# Patient Record
Sex: Male | Born: 1955 | Race: White | Hispanic: No | Marital: Single | Smoking: Never smoker
Health system: Southern US, Community
[De-identification: ages and names within clinical notes are randomized; demographics above are authoritative.]

## PROBLEM LIST (undated history)

## (undated) DIAGNOSIS — I10 Essential (primary) hypertension: Secondary | ICD-10-CM

## (undated) HISTORY — PX: HERNIA REPAIR: SHX51

---

## 2007-05-13 HISTORY — PX: DG GALL BLADDER: HXRAD326

## 2016-10-17 ENCOUNTER — Other Ambulatory Visit: Payer: Self-pay

## 2016-10-17 ENCOUNTER — Emergency Department (HOSPITAL_COMMUNITY): Payer: BLUE CROSS/BLUE SHIELD

## 2016-10-17 ENCOUNTER — Encounter (HOSPITAL_COMMUNITY): Payer: Self-pay | Admitting: Emergency Medicine

## 2016-10-17 ENCOUNTER — Emergency Department (HOSPITAL_COMMUNITY)
Admission: EM | Admit: 2016-10-17 | Discharge: 2016-10-17 | Disposition: A | Discharge status: Home / Self Care | Payer: BLUE CROSS/BLUE SHIELD | Attending: Emergency Medicine | Admitting: Emergency Medicine

## 2016-10-17 DIAGNOSIS — Z7982 Long term (current) use of aspirin: Secondary | ICD-10-CM | POA: Diagnosis not present

## 2016-10-17 DIAGNOSIS — R079 Chest pain, unspecified: Secondary | ICD-10-CM

## 2016-10-17 DIAGNOSIS — Z79899 Other long term (current) drug therapy: Secondary | ICD-10-CM | POA: Diagnosis not present

## 2016-10-17 DIAGNOSIS — M79602 Pain in left arm: Secondary | ICD-10-CM

## 2016-10-17 DIAGNOSIS — I10 Essential (primary) hypertension: Secondary | ICD-10-CM | POA: Diagnosis not present

## 2016-10-17 DIAGNOSIS — R002 Palpitations: Secondary | ICD-10-CM

## 2016-10-17 HISTORY — DX: Essential (primary) hypertension: I10

## 2016-10-17 LAB — CBC
HEMATOCRIT: 50.9 % (ref 39.0–52.0)
Hemoglobin: 17.8 g/dL — ABNORMAL HIGH (ref 13.0–17.0)
MCH: 31.2 pg (ref 26.0–34.0)
MCHC: 35 g/dL (ref 30.0–36.0)
MCV: 89.1 fL (ref 78.0–100.0)
Platelets: 185 10*3/uL (ref 150–400)
RBC: 5.71 MIL/uL (ref 4.22–5.81)
RDW: 13 % (ref 11.5–15.5)
WBC: 8.9 10*3/uL (ref 4.0–10.5)

## 2016-10-17 LAB — BASIC METABOLIC PANEL
Anion gap: 9 (ref 5–15)
BUN: 15 mg/dL (ref 6–20)
CHLORIDE: 105 mmol/L (ref 101–111)
CO2: 23 mmol/L (ref 22–32)
Calcium: 9.2 mg/dL (ref 8.9–10.3)
Creatinine, Ser: 1.1 mg/dL (ref 0.61–1.24)
GFR calc Af Amer: >60 mL/min (ref >60)
GFR calc non Af Amer: >60 mL/min (ref >60)
GLUCOSE: 147 mg/dL — AB (ref 65–99)
POTASSIUM: 3.8 mmol/L (ref 3.5–5.1)
Sodium: 137 mmol/L (ref 135–145)

## 2016-10-17 LAB — D-DIMER, QUANTITATIVE (NOT AT ARMC): D DIMER QUANT: 0.5 ug{FEU}/mL (ref 0.00–0.50)

## 2016-10-17 LAB — I-STAT TROPONIN, ED: Troponin i, poc: 0.01 ng/mL (ref 0.00–0.08)

## 2016-10-17 LAB — TROPONIN I: Troponin I: <0.03 ng/mL (ref <0.03)

## 2016-10-17 MED ORDER — MORPHINE SULFATE (PF) 4 MG/ML IV SOLN: 4.0000 mg | Freq: Once | INTRAVENOUS | Status: DC

## 2016-10-17 NOTE — ED Notes (Signed)
PT continues to report no chest pain. PT reports slight headache 3/10 and neck discomfort. BP elevated. PT is due for meds between 8-9pm. Dr. Rosalia Hammersay made aware.

## 2016-10-17 NOTE — ED Notes (Signed)
Returns from XRAY

## 2016-10-17 NOTE — ED Provider Notes (Signed)
[facility]EMER[facility]ENCY DEPT Provider Note   CSN: [id number] Arrival date & time: 10/17/16  1328     History   Chief Complaint Chief Complaint  Patient presents with  . Chest Pain    HPI Marco Vazquez is a 61 y.o. male.  HPI Pt felt left sided chest pain today associated with a racing heart beat.  He also felt the pain radiate into his arm.  He noticed the sx today when he woke up from a nap.   He felt nauseated and had some loose stools.  It lasted about an hour or so.  He has also been having sx occurring at night recently.  The gets short of breath and has the pain.  These sx have been getting worse.   No history of heart disease.  No history of lung disease.  Father had an MI in his 1s.  Pt does exercise regularly and never has pain or discomfort with that.   Past Medical History:  Diagnosis Date  . Hypertension     There are no active problems to display for this patient.   History reviewed. No pertinent surgical history.     Home Medications    Prior to Admission medications   Medication Sig Start Date End Date Taking? Authorizing Provider  aspirin EC 81 MG tablet Take 162 mg by mouth daily.   Yes [provider]  Cyanocobalamin (VITAMIN B 12 PO) Take 1 tablet by mouth daily.   Yes [provider]  losartan (COZAAR) 50 MG tablet Take 50 mg by mouth daily.   Yes [provider]  Multiple Vitamins-Minerals (MULTIVITAMIN WITH MINERALS) tablet Take 1 tablet by mouth daily.   Yes [provider]    Family History History reviewed. No pertinent family history.  Social History Social History  Substance Use Topics  . Smoking status: Never Smoker  . Smokeless tobacco: Never Used  . Alcohol use No     Allergies   Contrast media [iodinated diagnostic agents] and Sulfa antibiotics   Review of Systems Review of Systems  HENT:       Diagnosed with an inner ear infection, treated with prednisone, finished the rx one week ago    Neurological: Positive for dizziness.  All other systems reviewed and are negative.    Physical Exam Updated Vital Signs BP 133/85   Pulse 78   Temp 98.6 F (37 C) (Oral)   Resp 16   Ht 1.854 m (6\' 1" )   Wt 104.3 kg (230 lb)   SpO2 97%   BMI 30.34 kg/m   Physical Exam  Constitutional: He appears well-developed and well-nourished. No distress.  HENT:  Head: Normocephalic and atraumatic.  Right Ear: External ear normal.  Left Ear: External ear normal.  Eyes: Conjunctivae are normal. Right eye exhibits no discharge. Left eye exhibits no discharge. No scleral icterus.  Neck: Neck supple. No tracheal deviation present.  Cardiovascular: Normal rate, regular rhythm and intact distal pulses.   Pulmonary/Chest: Effort normal and breath sounds normal. No stridor. No respiratory distress. He has no wheezes. He has no rales.  Abdominal: Soft. Bowel sounds are normal. He exhibits no distension. There is no tenderness. There is no rebound and no guarding.  Musculoskeletal: He exhibits no edema or tenderness.  Neurological: He is alert. He has normal strength. No cranial nerve deficit (no facial droop, extraocular movements intact, no slurred speech) or sensory deficit. He exhibits normal muscle tone. He displays no seizure activity. Coordination normal.  Skin: Skin  is warm and dry. No rash noted.  Psychiatric: He has a normal mood and affect.  Nursing note and vitals reviewed.    ED Treatments / Results  Labs (all labs ordered are listed, but only abnormal results are displayed) Labs Reviewed  BASIC METABOLIC PANEL - Abnormal; Notable for the following:       Result Value   Glucose, Bld 147 (*)    All other components within normal limits  CBC - Abnormal; Notable for the following:    Hemoglobin 17.8 (*)    All other components within normal limits  D-DIMER, QUANTITATIVE (NOT AT [facility])  Rosezena SensorI-STAT TROPOININ, ED  I-STAT TROPOININ, ED    EKG  EKG  Interpretation  Date/Time:  Friday October 17 2016 13:48:50 EDT Ventricular Rate:  90 PR Interval:  140 QRS Duration: 96 QT Interval:  365 QTC Calculation: 447 R Axis:   85 Text Interpretation:  Sinus rhythm Probable left atrial enlargement Consider right ventricular hypertrophy No significant change since last tracing , improved ECG quality Confirmed by Linwood DibblesKnapp, Filip Luten [phone]([id number]) on 10/17/2016 1:50:55 PM       Radiology Dg Chest 2 View  Result Date: 10/17/2016 CLINICAL DATA:  Chest pain, dizziness, shortness of breath EXAM: CHEST  2 VIEW COMPARISON:  None available FINDINGS: Mild hyperinflation without focal pneumonia, collapse or consolidation. Negative for edema, effusion or pneumothorax. Trachea is midline. Normal heart size and vascularity. No acute osseous finding. Monitor leads overlie the chest. IMPRESSION: Mild hyperinflation.  No acute process. Electronically Signed   By: Judie PetitM.  Shick M.D.   On: 10/17/2016 14:47    Procedures Procedures (including critical care time)  Medications Ordered in ED Medications - No data to display   Initial Impression / Assessment and Plan / ED Course  I have reviewed the triage vital signs and the nursing notes.  Pertinent labs & imaging results that were available during my care of the patient were reviewed by me and considered in my medical decision making (see chart for details).   Pt presents to the ED with chest pain.  He exercises regularly and does not have chest pain with that.  Sx are less likely cardiac related.  Low risk for ACS.  Heart score of 2.  Will check d dimer and serial cardiac enzymes.  If negative I think he can safely follow up for an outpatient stress test.  Dr Rosalia Hammersay will follow up on 2nd troponin and dimer Final Clinical Impressions(s) / ED Diagnoses   Final diagnoses:  Chest pain, unspecified type      Linwood DibblesKnapp, Brinly Maietta, MD 10/17/16 1636

## 2016-10-17 NOTE — ED Notes (Signed)
Care handoff to Nikki, RN 

## 2016-10-17 NOTE — ED Provider Notes (Signed)
There is a 61 year old man seen by Dr. Lynelle DoctorKnapp received in sign out by me. Patient has had some episodic palpitations with left arm pain. He swims regularly and has not had symptoms while he has been exerting himself. Workup here reveals no evidence of acute ST elevation with normal troponin and delta troponin. He also had a d-dimer that was normal. I discussed the patient's options with him including admission. He prefers discharge with cardiology follow-up and this was the original plan with Dr. Lynelle DoctorKnapp. We have discussed return precautions including return of symptoms and need for close follow-up and he voices understanding.   Margarita Grizzleay, Caedence Snowden, MD 10/17/16 [id number]

## 2016-10-17 NOTE — Discharge Instructions (Signed)
Call cardiology office on Monday for follow up. Return if any return of symptoms.

## 2016-10-17 NOTE — ED Triage Notes (Signed)
Pt here with issues with inner ear x 3 weeks and having left sided CP into left arm with SOB upon waking this am

## 2016-10-17 NOTE — ED Notes (Addendum)
PT reports no chest pain at this time. PT reports a headache 3/10.

## 2016-10-18 ENCOUNTER — Observation Stay (HOSPITAL_COMMUNITY)
Admission: EM | Admit: 2016-10-18 | Discharge: 2016-10-20 | Disposition: A | Discharge status: Home / Self Care | Payer: BLUE CROSS/BLUE SHIELD | Attending: Family Medicine | Admitting: Family Medicine

## 2016-10-18 ENCOUNTER — Observation Stay (HOSPITAL_BASED_OUTPATIENT_CLINIC_OR_DEPARTMENT_OTHER): Payer: BLUE CROSS/BLUE SHIELD

## 2016-10-18 ENCOUNTER — Encounter (HOSPITAL_COMMUNITY): Payer: Self-pay | Admitting: Emergency Medicine

## 2016-10-18 ENCOUNTER — Emergency Department (HOSPITAL_COMMUNITY): Payer: BLUE CROSS/BLUE SHIELD

## 2016-10-18 DIAGNOSIS — R0789 Other chest pain: Principal | ICD-10-CM | POA: Insufficient documentation

## 2016-10-18 DIAGNOSIS — R079 Chest pain, unspecified: Secondary | ICD-10-CM

## 2016-10-18 DIAGNOSIS — I10 Essential (primary) hypertension: Secondary | ICD-10-CM | POA: Diagnosis not present

## 2016-10-18 DIAGNOSIS — Z91041 Radiographic dye allergy status: Secondary | ICD-10-CM | POA: Diagnosis not present

## 2016-10-18 DIAGNOSIS — Z882 Allergy status to sulfonamides status: Secondary | ICD-10-CM | POA: Insufficient documentation

## 2016-10-18 DIAGNOSIS — I259 Chronic ischemic heart disease, unspecified: Secondary | ICD-10-CM | POA: Diagnosis not present

## 2016-10-18 DIAGNOSIS — Z7982 Long term (current) use of aspirin: Secondary | ICD-10-CM | POA: Diagnosis not present

## 2016-10-18 DIAGNOSIS — I208 Other forms of angina pectoris: Secondary | ICD-10-CM | POA: Diagnosis not present

## 2016-10-18 DIAGNOSIS — R002 Palpitations: Secondary | ICD-10-CM | POA: Insufficient documentation

## 2016-10-18 LAB — ECHOCARDIOGRAM COMPLETE
HEIGHTINCHES: 73 in
WEIGHTICAEL: 3716.8 [oz_av]

## 2016-10-18 LAB — CBC
HCT: 49.4 % (ref 39.0–52.0)
HEMOGLOBIN: 16.7 g/dL (ref 13.0–17.0)
MCH: 30.3 pg (ref 26.0–34.0)
MCHC: 33.8 g/dL (ref 30.0–36.0)
MCV: 89.7 fL (ref 78.0–100.0)
Platelets: 164 10*3/uL (ref 150–400)
RBC: 5.51 MIL/uL (ref 4.22–5.81)
RDW: 13.1 % (ref 11.5–15.5)
WBC: 8.5 10*3/uL (ref 4.0–10.5)

## 2016-10-18 LAB — BASIC METABOLIC PANEL
ANION GAP: 8 (ref 5–15)
BUN: 14 mg/dL (ref 6–20)
CALCIUM: 8.9 mg/dL (ref 8.9–10.3)
CO2: 26 mmol/L (ref 22–32)
Chloride: 103 mmol/L (ref 101–111)
Creatinine, Ser: 1.03 mg/dL (ref 0.61–1.24)
GFR calc non Af Amer: >60 mL/min (ref >60)
Glucose, Bld: 94 mg/dL (ref 65–99)
Potassium: 3.6 mmol/L (ref 3.5–5.1)
Sodium: 137 mmol/L (ref 135–145)

## 2016-10-18 LAB — LIPID PANEL
Cholesterol: 165 mg/dL (ref 0–200)
HDL: 31 mg/dL — AB (ref >40)
LDL CALC: 95 mg/dL (ref 0–99)
Total CHOL/HDL Ratio: 5.3 RATIO
Triglycerides: 195 mg/dL — ABNORMAL HIGH (ref <150)
VLDL: 39 mg/dL (ref 0–40)

## 2016-10-18 LAB — TROPONIN I

## 2016-10-18 LAB — I-STAT TROPONIN, ED
TROPONIN I, POC: 0 ng/mL (ref 0.00–0.08)
Troponin i, poc: 0 ng/mL (ref 0.00–0.08)

## 2016-10-18 MED ORDER — MORPHINE SULFATE (PF) 4 MG/ML IV SOLN
2.0000 mg | INTRAVENOUS | Status: DC | PRN
  Administered 2016-10-20: 2 mg via INTRAVENOUS
  Filled 2016-10-18: qty 1

## 2016-10-18 MED ORDER — NITROGLYCERIN 0.4 MG SL SUBL
0.4000 mg | SUBLINGUAL_TABLET | SUBLINGUAL | Status: DC | PRN
  Administered 2016-10-18 – 2016-10-20 (×2): 0.4 mg via SUBLINGUAL
  Filled 2016-10-18 (×2): qty 1

## 2016-10-18 MED ORDER — ACETAMINOPHEN 325 MG PO TABS
650.0000 mg | ORAL_TABLET | ORAL | Status: DC | PRN
  Administered 2016-10-18 – 2016-10-20 (×5): 650 mg via ORAL
  Filled 2016-10-18 (×5): qty 2

## 2016-10-18 MED ORDER — ASPIRIN EC 81 MG PO TBEC
162.0000 mg | DELAYED_RELEASE_TABLET | Freq: Every day | ORAL | Status: DC
  Administered 2016-10-18 – 2016-10-20 (×3): 162 mg via ORAL
  Filled 2016-10-18 (×3): qty 2

## 2016-10-18 MED ORDER — ONDANSETRON HCL 4 MG/2ML IJ SOLN: 4.0000 mg | Freq: Four times a day (QID) | INTRAMUSCULAR | Status: DC | PRN

## 2016-10-18 MED ORDER — ENOXAPARIN SODIUM 40 MG/0.4ML ~~LOC~~ SOLN
40.0000 mg | Freq: Every day | SUBCUTANEOUS | Status: DC
  Administered 2016-10-18: 40 mg via SUBCUTANEOUS
  Filled 2016-10-18 (×2): qty 0.4

## 2016-10-18 MED ORDER — LOSARTAN POTASSIUM 50 MG PO TABS
50.0000 mg | ORAL_TABLET | Freq: Every day | ORAL | Status: DC
  Administered 2016-10-18 – 2016-10-20 (×3): 50 mg via ORAL
  Filled 2016-10-18 (×3): qty 1

## 2016-10-18 MED ORDER — NITROGLYCERIN IN D5W 200-5 MCG/ML-% IV SOLN
0.0000 ug/min | Freq: Once | INTRAVENOUS | Status: AC
Start: 2016-10-18 — End: 2016-10-18
  Administered 2016-10-18: 5 ug/min via INTRAVENOUS
  Filled 2016-10-18: qty 250

## 2016-10-18 MED ORDER — GI COCKTAIL ~~LOC~~: 30.0000 mL | Freq: Four times a day (QID) | ORAL | Status: DC | PRN

## 2016-10-18 NOTE — Progress Notes (Signed)
Pt got to room 2w10 @ 0610 alert and oriented times 3. Denied pain on arrival. No skin issues noted. He was oriented to call light and bathroom.

## 2016-10-18 NOTE — ED Triage Notes (Signed)
Pt reports L sided CP with radiation to L arm and jaw that woke him up from sleep approx 0000. Reports nausea, SOB, and dizziness. Seen yesterday for same S/S and was DC, told to return if pain came back. A/OX4.

## 2016-10-18 NOTE — ED Provider Notes (Signed)
[facility] Provider Note   CSN: [id number] Arrival date & time: 10/18/16  0036     History   Chief Complaint Chief Complaint  Patient presents with  . Chest Pain    HPI Marco Vazquez is a 61 y.o. male.  Patient with history of HTN presents with left chest pain that woke him from sleep tonight, associated with left arm pain, SOB and nausea. He reports he has not felt in his usual state of health for the past one day, noting a loss of appetite and energy. No fever, cough, vomiting, diarrhea or URI symptoms. Chest pain is intermittent and does not seem to be exertional. He was seen in the ED yesterday afternoon for similar pain and declined admission that was offered. He returned to the emergency department after the pain woke him from sleep tonight.    The history is provided by the patient. No language interpreter was used.  Chest Pain   Associated symptoms include nausea and shortness of breath. Pertinent negatives include no fever, no headaches and no weakness.    Past Medical History:  Diagnosis Date  . Hypertension     There are no active problems to display for this patient.   History reviewed. No pertinent surgical history.     Home Medications    Prior to Admission medications   Medication Sig Start Date End Date Taking? Authorizing Provider  aspirin EC 81 MG tablet Take 162 mg by mouth daily.    [provider]  Cyanocobalamin (VITAMIN B 12 PO) Take 1 tablet by mouth daily.    [provider]  losartan (COZAAR) 50 MG tablet Take 50 mg by mouth daily.    [provider]  Multiple Vitamins-Minerals (MULTIVITAMIN WITH MINERALS) tablet Take 1 tablet by mouth daily.    [provider]    Family History No family history on file.  Social History Social History  Substance Use Topics  . Smoking status: Never Smoker  . Smokeless tobacco: Never Used  . Alcohol use No     Allergies   Contrast media [iodinated  diagnostic agents] and Sulfa antibiotics   Review of Systems Review of Systems  Constitutional: Positive for activity change and appetite change. Negative for chills and fever.  Respiratory: Positive for shortness of breath.   Cardiovascular: Positive for chest pain.  Gastrointestinal: Positive for nausea.  Musculoskeletal:       Chest pain that radiates to left arm  Skin: Negative.   Neurological: Negative.  Negative for weakness and headaches.     Physical Exam Updated Vital Signs BP 117/76   Pulse 64   Temp 98 F (36.7 C) (Oral)   Resp 17   Ht 6\' 1"  (1.854 m)   Wt 104.3 kg (230 lb)   SpO2 96%   BMI 30.34 kg/m   Physical Exam  Constitutional: He is oriented to person, place, and time. He appears well-developed and well-nourished.  HENT:  Head: Normocephalic.  Eyes: Conjunctivae are normal.  Neck: Normal range of motion. Neck supple.  Cardiovascular: Normal rate and regular rhythm.   No murmur heard. Pulmonary/Chest: Effort normal and breath sounds normal. He has no wheezes. He has no rales. He exhibits tenderness.  Abdominal: Soft. Bowel sounds are normal. There is no tenderness. There is no rebound and no guarding.  Musculoskeletal: Normal range of motion. He exhibits no edema.  Neurological: He is alert and oriented to person, place, and time.  Skin: Skin is warm and dry. No rash  noted.  Psychiatric: He has a normal mood and affect.     ED Treatments / Results  Labs (all labs ordered are listed, but only abnormal results are displayed) Labs Reviewed  BASIC METABOLIC PANEL  CBC  I-STAT TROPOININ, ED    EKG  EKG Interpretation  Date/Time:  Saturday October 18 2016 00:42:28 EDT Ventricular Rate:  82 PR Interval:  146 QRS Duration: 94 QT Interval:  388 QTC Calculation: 453 R Axis:   56 Text Interpretation:  Normal sinus rhythm Possible Left atrial enlargement Borderline ECG No significant change was found Confirmed by Glynn Octaveancour, Stephen [phone]([id number]) on 10/18/2016  12:58:34 AM       Radiology Dg Chest 2 View  Result Date: 10/18/2016 CLINICAL DATA:  Chest pain and dizziness. EXAM: CHEST  2 VIEW COMPARISON:  Radiographs yesterday at 1437 hour FINDINGS: The cardiomediastinal contours are normal. Stable hyperinflation. Pulmonary vasculature is normal. No consolidation, pleural effusion, or pneumothorax. No acute osseous abnormalities are seen. IMPRESSION: Stable hyperinflation. No acute abnormality, no change from prior exam. Electronically Signed   By: Rubye OaksMelanie  Ehinger M.D.   On: 10/18/2016 01:37   Dg Chest 2 View  Result Date: 10/17/2016 CLINICAL DATA:  Chest pain, dizziness, shortness of breath EXAM: CHEST  2 VIEW COMPARISON:  None available FINDINGS: Mild hyperinflation without focal pneumonia, collapse or consolidation. Negative for edema, effusion or pneumothorax. Trachea is midline. Normal heart size and vascularity. No acute osseous finding. Monitor leads overlie the chest. IMPRESSION: Mild hyperinflation.  No acute process. Electronically Signed   By: Judie PetitM.  Shick M.D.   On: 10/17/2016 14:47    Procedures Procedures (including critical care time)  Medications Ordered in ED Medications  nitroGLYCERIN (NITROSTAT) SL tablet 0.4 mg (0.4 mg Sublingual Given 10/18/16 0210)  nitroGLYCERIN 50 mg in dextrose 5 % 250 mL (0.2 mg/mL) infusion (15 mcg/min Intravenous Rate/Dose Change 10/18/16 0312)     Initial Impression / Assessment and Plan / ED Course  I have reviewed the triage vital signs and the nursing notes.  Pertinent labs & imaging results that were available during my care of the patient were reviewed by me and considered in my medical decision making (see chart for details).     Patient with CP concerning for ACS, without ST elevations or positive initial troponin. He has Heart Score of 4.   He is seen and evaluated by Dr. Manus Gunningancour. During episode of active pain an EKG was repeated and is unchanged. He is given SL NTG with some relief. IV drip  started.   Cardiology paged for admission. Patient agrees for plan of admission.  Pr cardiology, medicine to admit with cardiology consultation. D[facility] Dr[facility] Mad[facility] Flavorsondell Broeker[facility].  Final Clinical Impressions(s) / ED Diagnoses   Final diagnoses:  None   1. Chest pain  New Prescriptions New Prescriptions   No medications on file     Danne HarborUpstill, Yesennia Hirota, PA-C 10/31/16 [id number]    Glynn Octaveancour, Stephen, MD 11/03/16 1057

## 2016-10-18 NOTE — Progress Notes (Signed)
*  PRELIMINARY RESULTS* Echocardiogram 2D Echocardiogram has been performed.  Marco Vazquez, Marco Vazquez 10/18/2016, 3:48 PM

## 2016-10-18 NOTE — ED Notes (Signed)
Repaged Cards  

## 2016-10-18 NOTE — Progress Notes (Signed)
Patient seen and evaluated earlier this am by my associate. Please refer to H and P for details. Cardiology on board and has recommended stress test. Further plans pending stress test results.  VSS  Will reassess next am or sooner should new medical problem arise or patient be cleared for discharge.  Niaomi Cartaya, [employer]

## 2016-10-18 NOTE — H&P (Signed)
History and Physical    Marco Vazquez ZOX:[id number] DOB: 08-18-1955 DOA: 10/18/2016  Referring MD/NP/PA: Elpidio Anis, PA-C PCP: Elias Else, MD  Patient coming from: Home  Chief Complaint: Chest pain  HPI: Marco Vazquez is a 61 y.o. male with medical history significant of HTN, who presents with intermittent left-sided chest pain since yesterday.  He describes the pain as pressure-like and reported numbness/ tingling pain in the left arm. Associated symptoms included nausea, palpitations, loose stool, diaphoresis, and shortness of breath. At baseline patient was very active and swims. This all occurred yesterday morning and he was evaluated in the emergency department, but workup was otherwise noted to be negative including no evidence of ST elevation on EKG, d-dimer, troponin, and delta troponin. They discussed with the patient possibly being admitted at that time, but the patient wished to be discharged to follow-up with cardiac and GI as an outpatient. They discussed precautions for the patient to return. After the patient went home and went to sleep he was awakened out of sleep with left-sided chest pain with tingling pain in the left arm. Noted associated symptoms of palpitations and nausea, but denied having any diaphoresis. Patient notes that his father had heart issues in his 49s. Denies any vomiting, fever, chills, cough, abdominal pain, or loss of consciousness.  ED Course: Upon admission into the emergency department patient was seen have vital signs relatively within normal limits. Labs revealed no acute abnormalities including troponin and delta troponin. Chest x-ray showed stable hyperinflation. EKG otherwise showed no acute signs of ischemia. Patient was placed on nitroglycerin drip as he noted that it helped relieve pain symptoms. Cardiology was consulted, but recommended admission to hospitalist service for continued trending of enzymes.   Review of Systems: As per HPI otherwise 10  point review of systems negative.   Past Medical History:  Diagnosis Date  . Hypertension     History reviewed. No pertinent surgical history.   reports that he has never smoked. He has never used smokeless tobacco. He reports that he does not drink alcohol or use drugs.  Allergies  Allergen Reactions  . Contrast Media [Iodinated Diagnostic Agents]     "started sneezing "  . Sulfa Antibiotics     "everything speeds up"    No family history on file.  Prior to Admission medications   Medication Sig Start Date End Date Taking? Authorizing Provider  aspirin EC 81 MG tablet Take 162 mg by mouth daily.    [provider]  Cyanocobalamin (VITAMIN B 12 PO) Take 1 tablet by mouth daily.    [provider]  losartan (COZAAR) 50 MG tablet Take 50 mg by mouth daily.    [provider]  Multiple Vitamins-Minerals (MULTIVITAMIN WITH MINERALS) tablet Take 1 tablet by mouth daily.    [provider]    Physical Exam:   Constitutional: Well-developed male who appears NAD, calm, comfortable Vitals:   10/18/16 0330 10/18/16 0345 10/18/16 0415 10/18/16 0430  BP: 102/66 98/65 105/70 106/70  Pulse: 70 69 71 65  Resp: 13 15 19 14   Temp:      TempSrc:      SpO2: 95%  94% 95%  Weight:      Height:       Eyes: PERRL, lids and conjunctivae normal ENMT: Mucous membranes are moist. Posterior pharynx clear of any exudate or lesions. Normal dentition.  Neck: normal, supple, no masses, no thyromegaly Respiratory: clear to auscultation bilaterally, no wheezing, no crackles. Normal respiratory effort.  No accessory muscle use.  Cardiovascular: Regular rate and rhythm, no murmurs / rubs / gallops. No extremity edema. 2+ pedal pulses. No carotid bruits. No tenderness to palpation of the chest wall Abdomen: no tenderness, no masses palpated. No hepatosplenomegaly. Bowel sounds positive.  Musculoskeletal: no clubbing / cyanosis. No joint deformity upper and lower  extremities. Good ROM, no contractures. Normal muscle tone.  Skin: no rashes, lesions, ulcers. No induration Neurologic: CN 2-12 grossly intact. Sensation intact, DTR normal. Strength 5/5 in all 4.  Psychiatric: Normal judgment and insight. Alert and oriented x 3. Anxious mood.     Labs on Admission: I have personally reviewed following labs and imaging studies  CBC:  Recent Labs Lab 10/17/16 1336 10/18/16 0041  WBC 8.9 8.5  HGB 17.8* 16.7  HCT 50.9 49.4  MCV 89.1 89.7  PLT 185 164   Basic Metabolic Panel:  Recent Labs Lab 10/17/16 1336 10/18/16 0041  NA 137 137  K 3.8 3.6  CL 105 103  CO2 23 26  GLUCOSE 147* 94  BUN 15 14  CREATININE 1.10 1.03  CALCIUM 9.2 8.9   GFR: Estimated Creatinine Clearance: 95.6 mL/min (by C-G formula based on SCr of 1.03 mg/dL). Liver Function Tests: No results for input(s): AST, ALT, ALKPHOS, BILITOT, PROT, ALBUMIN in the last 168 hours. No results for input(s): LIPASE, AMYLASE in the last 168 hours. No results for input(s): AMMONIA in the last 168 hours. Coagulation Profile: No results for input(s): INR, PROTIME in the last 168 hours. Cardiac Enzymes:  Recent Labs Lab 10/17/16 1644  TROPONINI <0.03   BNP (last 3 results) No results for input(s): PROBNP in the last 8760 hours. HbA1C: No results for input(s): HGBA1C in the last 72 hours. CBG: No results for input(s): GLUCAP in the last 168 hours. Lipid Profile: No results for input(s): CHOL, HDL, LDLCALC, TRIG, CHOLHDL, LDLDIRECT in the last 72 hours. Thyroid Function Tests: No results for input(s): TSH, T4TOTAL, FREET4, T3FREE, THYROIDAB in the last 72 hours. Anemia Panel: No results for input(s): VITAMINB12, FOLATE, FERRITIN, TIBC, IRON, RETICCTPCT in the last 72 hours. Urine analysis: No results found for: COLORURINE, APPEARANCEUR, LABSPEC, PHURINE, GLUCOSEU, HGBUR, BILIRUBINUR, KETONESUR, PROTEINUR, UROBILINOGEN, NITRITE, LEUKOCYTESUR Sepsis Labs: No results found for  this or any previous visit (from the past 240 hour(s)).   Radiological Exams on Admission: Dg Chest 2 View  Result Date: 10/18/2016 CLINICAL DATA:  Chest pain and dizziness. EXAM: CHEST  2 VIEW COMPARISON:  Radiographs yesterday at 1437 hour FINDINGS: The cardiomediastinal contours are normal. Stable hyperinflation. Pulmonary vasculature is normal. No consolidation, pleural effusion, or pneumothorax. No acute osseous abnormalities are seen. IMPRESSION: Stable hyperinflation. No acute abnormality, no change from prior exam. Electronically Signed   By: Rubye Oaks M.D.   On: 10/18/2016 01:37   Dg Chest 2 View  Result Date: 10/17/2016 CLINICAL DATA:  Chest pain, dizziness, shortness of breath EXAM: CHEST  2 VIEW COMPARISON:  None available FINDINGS: Mild hyperinflation without focal pneumonia, collapse or consolidation. Negative for edema, effusion or pneumothorax. Trachea is midline. Normal heart size and vascularity. No acute osseous finding. Monitor leads overlie the chest. IMPRESSION: Mild hyperinflation.  No acute process. Electronically Signed   By: Judie Petit.  Shick M.D.   On: 10/17/2016 14:47    EKG: Independently reviewed. Normal sinus rhythm with possible left atrial enlargement  Assessment/Plan Chest pain: Patient has a concerning story although patient has had negative troponins and EKG otherwise unchanged. Heart score 4. - Admitted to a telemetry bed -  Trend cardiac enzymes - continue Nitro gtt - Check echocardiogram and lipid panel - IVF NS at 75ml/hr - Continue aspi39rin - Cardiology consulted, follow-up for further recommendations  Essential hypertension - Continue losartan   DVT prophylaxis: Lovenox   Code Status: Full Family Communication: No family present at bedside Disposition Plan: Discharge home once medically stable Consults called: Cardiology  Admission status: Observation  Clydie Braunondell A Kamer MD [facility] Pager [phone]  If 7PM-7AM, please contact  night-coverage [email or web address] Password [email or web address]  10/18/2016, 4:57 AM

## 2016-10-18 NOTE — Consult Note (Signed)
Cardiology Consultation:   Patient ID: Marco Vazquez; [id number]; Aug 01, 1955   Admit date: 10/18/2016 Date of Consult: 10/18/2016  Primary Care Provider: Elias Elseeade, Robert, MD Primary Cardiologist: new Primary Electrophysiologist:  new   Patient Profile:   Marco Vazquez is a 61 y.o. male referred for evaluation of chest pain  History of Present Illness:   Marco Vazquez  with a hx of HTN who is being seen today for the evaluation of chest pain at the request of Dr. Cena BentonVega.The patient is very active and swims on a regular basis. He developed an ear infection about one week ago and was treated with prednisone. He began to have a heaviness and fullness in his left arm and chest associated with symptoms of hard heart beating. He was seen on several occasions as an outpatient before presenting with worsening symptoms which were relieved with nitroglycerin and associated with shortness of breath and nausea. He has ruled out for myocardial infarction and his ECG is unrevealing. He is referred for additional evaluation. The patient has no family history of coronary disease except that he notes his father had a "mild heart attack" at age 62, but is currently 6890 and otherwise healthy. The patient has remained active and since his current episode began has not been swimming. He has been monitored overnight with no arrhythmias.  Past Medical History:  Diagnosis Date  . Hypertension     Past Surgical History:  Procedure Laterality Date  . DG GALL BLADDER  2009   removed  . HERNIA REPAIR       Inpatient Medications: Scheduled Meds: . aspirin EC  162 mg Oral Daily  . enoxaparin (LOVENOX) injection  40 mg Subcutaneous Daily  . losartan  50 mg Oral Daily   Continuous Infusions:  PRN Meds: acetaminophen, gi cocktail, morphine injection, nitroGLYCERIN, ondansetron (ZOFRAN) IV  Allergies:    Allergies  Allergen Reactions  . Contrast Media [Iodinated Diagnostic Agents]     "started sneezing "  . Sulfa  Antibiotics     "everything speeds up"    Social History:   Social History   Social History  . Marital status: Single    Spouse name: N/A  . Number of children: N/A  . Years of education: N/A   Occupational History  . Not on file.   Social History Main Topics  . Smoking status: Never Smoker  . Smokeless tobacco: Never Used  . Alcohol use No  . Drug use: No  . Sexual activity: No   Other Topics Concern  . Not on file   Social History Narrative  . No narrative on file    Family History:   The patient's family history is not on file.  ROS:  Please see the history of present illness.  ROS  All other ROS reviewed and negative.     Physical Exam/Data:   Vitals:   10/18/16 0515 10/18/16 0530 10/18/16 0553 10/18/16 0610  BP: 105/69 102/76  125/77  Pulse: 70 61  67  Resp: 13 16  17   Temp:    97.9 F (36.6 C)  TempSrc:    Oral  SpO2: 92% 94%  96%  Weight:    232 lb 4.8 oz (105.4 kg)  Height:   6\' 1"  (1.854 m)     Intake/Output Summary (Last 24 hours) at 10/18/16 1057 Last data filed at 10/18/16 0904  Gross per 24 hour  Intake  0 ml  Output                0 ml  Net                0 ml   Filed Weights   10/18/16 0040 10/18/16 0610  Weight: 230 lb (104.3 kg) 232 lb 4.8 oz (105.4 kg)   Body mass index is 30.65 kg/m.  General:  Well nourished, well developed, in no acute distress HEENT: normal Lymph: no adenopathy Neck: 6 cm JVD Endocrine:  No thryomegaly Vascular: No carotid bruits; FA pulses 2+ bilaterally without bruits  Cardiac:  normal S1, S2; RRR; no murmur  Lungs:  clear to auscultation bilaterally, no wheezing, rhonchi or rales  Abd: soft, nontender, no hepatomegaly  Ext: no edema Musculoskeletal:  No deformities, BUE and BLE strength normal and equal Skin: warm and dry  Neuro:  CNs 2-12 intact, no focal abnormalities noted Psych:  Normal affect    EKG:  The EKG was personally reviewed and demonstrates normal sinus rhythm with  normal axis and intervals  Relevant CV Studies: Cardiac enzymes are all negative  Laboratory Data:  Chemistry Recent Labs Lab 10/17/16 1336 10/18/16 0041  NA 137 137  K 3.8 3.6  CL 105 103  CO2 23 26  GLUCOSE 147* 94  BUN 15 14  CREATININE 1.10 1.03  CALCIUM 9.2 8.9  GFRNONAA >60 >60  GFRAA >60 >60  ANIONGAP 9 8    No results for input(s): PROT, ALBUMIN, AST, ALT, ALKPHOS, BILITOT in the last 168 hours. Hematology Recent Labs Lab 10/17/16 1336 10/18/16 0041  WBC 8.9 8.5  RBC 5.71 5.51  HGB 17.8* 16.7  HCT 50.9 49.4  MCV 89.1 89.7  MCH 31.2 30.3  MCHC 35.0 33.8  RDW 13.0 13.1  PLT 185 164   Cardiac Enzymes Recent Labs Lab 10/17/16 1644 10/18/16 0507 10/18/16 0803  TROPONINI <0.03 <0.03 <0.03    Recent Labs Lab 10/17/16 1356 10/18/16 0054 10/18/16 0357  TROPIPOC 0.01 0.00 0.00    BNPNo results for input(s): BNP, PROBNP in the last 168 hours.  DDimer  Recent Labs Lab 10/17/16 1644  DDIMER 0.50    Radiology/Studies:  Dg Chest 2 View  Result Date: 10/18/2016 CLINICAL DATA:  Chest pain and dizziness. EXAM: CHEST  2 VIEW COMPARISON:  Radiographs yesterday at 1437 hour FINDINGS: The cardiomediastinal contours are normal. Stable hyperinflation. Pulmonary vasculature is normal. No consolidation, pleural effusion, or pneumothorax. No acute osseous abnormalities are seen. IMPRESSION: Stable hyperinflation. No acute abnormality, no change from prior exam. Electronically Signed   By: Rubye Oaks M.D.   On: 10/18/2016 01:37   Dg Chest 2 View  Result Date: 10/17/2016 CLINICAL DATA:  Chest pain, dizziness, shortness of breath EXAM: CHEST  2 VIEW COMPARISON:  None available FINDINGS: Mild hyperinflation without focal pneumonia, collapse or consolidation. Negative for edema, effusion or pneumothorax. Trachea is midline. Normal heart size and vascularity. No acute osseous finding. Monitor leads overlie the chest. IMPRESSION: Mild hyperinflation.  No acute process.  Electronically Signed   By: Judie Petit.  Shick M.D.   On: 10/17/2016 14:47    Assessment and Plan:   1. Atypical chest pain - the patient has some typical but mostly atypical features with his chest pain and has ruled out for MI and has no ECG abnormalities. I've recommended that he undergo exercise stress testing prior to discharge. If his stress test is moderate to high risk then left heart catheterization will be recommended. 2.  Palpitations - I suspect this is related to his steroid use. His steroids have been discontinued and he has had no arrhythmias on the monitor. He will undergo watchful waiting. We might consider having him wear a cardiac monitor as an outpatient. 3. Hypertension - his blood pressure is currently well-controlled. No change in medications at this time.   Signed, Lewayne Bunting, MD  10/18/2016 10:57 AM

## 2016-10-19 ENCOUNTER — Observation Stay (HOSPITAL_BASED_OUTPATIENT_CLINIC_OR_DEPARTMENT_OTHER): Payer: BLUE CROSS/BLUE SHIELD

## 2016-10-19 DIAGNOSIS — R079 Chest pain, unspecified: Secondary | ICD-10-CM | POA: Diagnosis not present

## 2016-10-19 DIAGNOSIS — I208 Other forms of angina pectoris: Secondary | ICD-10-CM | POA: Diagnosis not present

## 2016-10-19 LAB — NM MYOCAR MULTI W/SPECT W/WALL MOTION / EF
CHL CUP RESTING HR STRESS: 57 {beats}/min
CSEPED: 8 min
CSEPEDS: 28 s
CSEPEW: 10.1 METS
CSEPHR: 88 %
MPHR: 159 {beats}/min
Peak HR: 141 {beats}/min

## 2016-10-19 MED ORDER — TECHNETIUM TC 99M TETROFOSMIN IV KIT
10.0000 | PACK | Freq: Once | INTRAVENOUS | Status: AC | PRN
  Administered 2016-10-19: 10 via INTRAVENOUS

## 2016-10-19 MED ORDER — ZOLPIDEM TARTRATE 5 MG PO TABS
5.0000 mg | ORAL_TABLET | Freq: Every evening | ORAL | Status: DC | PRN
  Administered 2016-10-19: 5 mg via ORAL
  Filled 2016-10-19: qty 1

## 2016-10-19 MED ORDER — TECHNETIUM TC 99M TETROFOSMIN IV KIT
30.0000 | PACK | Freq: Once | INTRAVENOUS | Status: AC | PRN
  Administered 2016-10-19: 30 via INTRAVENOUS

## 2016-10-19 NOTE — Progress Notes (Addendum)
Patient Name: Marco Vazquez Date of Encounter: 10/19/2016  [facility]  [facility] Problem List     Principal Problem:   Chest pain Active Problems:   Essential hypertension     Subjective   Seen in nuclear medicine for exercise stress testing. No acute ST or TW changes on exercise portion of stress test.   Inpatient Medications    Scheduled Meds: . aspirin EC  162 mg Oral Daily  . enoxaparin (LOVENOX) injection  40 mg Subcutaneous Daily  . losartan  50 mg Oral Daily   Continuous Infusions:  PRN Meds: acetaminophen, gi cocktail, morphine injection, nitroGLYCERIN, ondansetron (ZOFRAN) IV   Vital Signs    Vitals:   10/18/16 0610 10/18/16 1514 10/18/16 2024 10/19/16 0547  BP: 125/77 (!) 116/53 119/66 109/72  Pulse: 67 69 64 65  Resp: 17 18 18 18   Temp: 97.9 F (36.6 C)  97.7 F (36.5 C) 98.3 F (36.8 C)  TempSrc: Oral  Oral Oral  SpO2: 96% 97% 97% 97%  Weight: 232 lb 4.8 oz (105.4 kg)     Height:        Intake/Output Summary (Last 24 hours) at 10/19/16 0903 Last data filed at 10/18/16 1600  Gross per 24 hour  Intake           433.78 ml  Output                2 ml  Net           431.78 ml   Filed Weights   10/18/16 0040 10/18/16 0610  Weight: 230 lb (104.3 kg) 232 lb 4.8 oz (105.4 kg)    Physical Exam    GEN: Well nourished, well developed, in no acute distress.  HEENT: Grossly normal.  Neck: Supple, no JVD, carotid bruits, or masses. Cardiac: RRR, no murmurs, rubs, or gallops. No clubbing, cyanosis, edema.  Radials/DP/PT 2+ and equal bilaterally.  Respiratory:  Respirations regular and unlabored, clear to auscultation bilaterally. GI: Soft, nontender, nondistended, BS + x 4. MS: no deformity or atrophy. Skin: warm and dry, no rash. Neuro:  Strength and sensation are intact. Psych: AAOx3.  Normal affect.  Labs    CBC  Recent Labs  10/17/16 1336 10/18/16 0041  WBC 8.9 8.5  HGB 17.8* 16.7  HCT 50.9 49.4  MCV 89.1 89.7  PLT 185  164   Basic Metabolic Panel  Recent Labs  10/17/16 1336 10/18/16 0041  NA 137 137  K 3.8 3.6  CL 105 103  CO2 23 26  GLUCOSE 147* 94  BUN 15 14  CREATININE 1.10 1.03  CALCIUM 9.2 8.9   Liver Function Tests No results for input(s): AST, ALT, ALKPHOS, BILITOT, PROT, ALBUMIN in the last 72 hours. No results for input(s): LIPASE, AMYLASE in the last 72 hours. Cardiac Enzymes  Recent Labs  10/17/16 1644 10/18/16 0507 10/18/16 0803  TROPONINI <0.03 <0.03 <0.03   BNP Invalid input(s): POCBNP D-Dimer  Recent Labs  10/17/16 1644  DDIMER 0.50   Hemoglobin A1C No results for input(s): HGBA1C in the last 72 hours. Fasting Lipid Panel  Recent Labs  10/18/16 0507  CHOL 165  HDL 31*  LDLCALC 95  TRIG 409195*  CHOLHDL 5.3   Thyroid Function Tests No results for input(s): TSH, T4TOTAL, T3FREE, THYROIDAB in the last 72 hours.  Invalid input(s): FREET3  Telemetry    NSR on tele in nuc med - Personally Reviewed  ECG    normal sinus rhythm with normal  axis and intervals - Personally Reviewed  Radiology    Dg Chest 2 View  Result Date: 10/18/2016 CLINICAL DATA:  Chest pain and dizziness. EXAM: CHEST  2 VIEW COMPARISON:  Radiographs yesterday at 1437 hour FINDINGS: The cardiomediastinal contours are normal. Stable hyperinflation. Pulmonary vasculature is normal. No consolidation, pleural effusion, or pneumothorax. No acute osseous abnormalities are seen. IMPRESSION: Stable hyperinflation. No acute abnormality, no change from prior exam. Electronically Signed   By: Rubye Oaks M.D.   On: 10/18/2016 01:37   Dg Chest 2 View  Result Date: 10/17/2016 CLINICAL DATA:  Chest pain, dizziness, shortness of breath EXAM: CHEST  2 VIEW COMPARISON:  None available FINDINGS: Mild hyperinflation without focal pneumonia, collapse or consolidation. Negative for edema, effusion or pneumothorax. Trachea is midline. Normal heart size and vascularity. No acute osseous finding. Monitor  leads overlie the chest. IMPRESSION: Mild hyperinflation.  No acute process. Electronically Signed   By: Judie Petit.  Shick M.D.   On: 10/17/2016 14:47    Cardiac Studies    2D ECHO: 10/18/2016 LV EF: 55% -   60% Study Conclusions - Left ventricle: The cavity size was normal. Systolic function was   normal. The estimated ejection fraction was in the range of 55%   to 60%. Wall motion was normal; there were no regional wall   motion abnormalities. Left ventricular diastolic function   parameters were normal. - Aortic valve: There was trivial regurgitation. - Mitral valve: Valve area by pressure half-time: 1.75 cm^2. - Atrial septum: No defect or patent foramen ovale was identified.   Patient Profile     Marco Vazquez is a 61 y.o. male with a history of HTN and possible family hx of CAD who presented to [facility] ED on 10/17/16 with chest pain. He ruled out and was discharged home from the ED. He then returned on 10/18/16 with recurrent chest pain and palpitations and was admitted for further work up and evaluation.  Assessment & Plan    Chest pain: he has ruled out for MI. 2D ECHO with normal LV function and no WMAs. He is undergoing exercise myoview today. AWAIT NUCLEAR IMAGES. If low risk, he can likely be discharged home.   Palpitations: felt to be 2/2 to a recent steroid taper for an ear infection. Tele has been unremarkable so far. We may consider an outpatient monitor.  HTN: BP well controlled at this time.    Signed, Cline Crock, PA-C  10/19/2016, 9:03 AM   Cardiology Attending  Patient seen and examined. He has undergone exercise myoview stess testing. If low risk he can be discharged home with outpatient cardiology followup. I would suggest he try and avoid oral steroids.  Leonia Reeves.D.

## 2016-10-19 NOTE — Progress Notes (Signed)
PROGRESS NOTE    Marco Vazquez  ZOX:[id number] DOB: [birth year] DOA: 10/18/2016 PCP: Elias Elseeade, Robert, MD    Brief Narrative:   61 y.o. male with medical history significant of HTN, who presents with intermittent left-sided chest pain since yesterday.  He describes the pain as pressure-like and reported numbness/ tingling pain in the left arm.  Stress test came back high risk. Cardiology planning further work up   Assessment & Plan:   Principal Problem:   Chest pain - Cardiology on board and assisting - high risk on stress testing.  Active Problems:   Essential hypertension -  On losartan, stable  DVT prophylaxis: Lovenox Code Status: Full Family Communication: d/c spouse Disposition Plan: once cleared for d/c by cardiology.   Consultants:   Cardiology   Procedures: myoview   Antimicrobials: none   Subjective: Pt has no new complaints.   Objective: Vitals:   10/19/16 1010 10/19/16 1012 10/19/16 1014 10/19/16 1248  BP: (!) 212/80 (!) 176/92 (!) 158/93 117/71  Pulse:    75  Resp:    18  Temp:    98.4 F (36.9 C)  TempSrc:    Oral  SpO2:    97%  Weight:      Height:        Intake/Output Summary (Last 24 hours) at 10/19/16 1819 Last data filed at 10/19/16 1720  Gross per 24 hour  Intake              480 ml  Output                1 ml  Net              479 ml   Filed Weights   10/18/16 0040 10/18/16 0610  Weight: 104.3 kg (230 lb) 105.4 kg (232 lb 4.8 oz)    Examination:  General exam: Appears calm and comfortable, in nad. Respiratory system: Clear to auscultation. Respiratory effort normal. Cardiovascular system: S1 & S2 heard, RRR.  Gastrointestinal system: Abdomen is nondistended, soft and nontender. No organomegaly or masses felt. Normal bowel sounds heard. Central nervous system: Alert and oriented. No focal neurological deficits. Extremities: Symmetric 5 x 5 power. Skin: No rashes, lesions or ulcers, on limited exam. Psychiatry: Mood & affect  appropriate.   Data Reviewed: I have personally reviewed following labs and imaging studies  CBC:  Recent Labs Lab 10/17/16 1336 10/18/16 0041  WBC 8.9 8.5  HGB 17.8* 16.7  HCT 50.9 49.4  MCV 89.1 89.7  PLT 185 164   Basic Metabolic Panel:  Recent Labs Lab 10/17/16 1336 10/18/16 0041  NA 137 137  K 3.8 3.6  CL 105 103  CO2 23 26  GLUCOSE 147* 94  BUN 15 14  CREATININE 1.10 1.03  CALCIUM 9.2 8.9   GFR: Estimated Creatinine Clearance: 96 mL/min (by C-G formula based on SCr of 1.03 mg/dL). Liver Function Tests: No results for input(s): AST, ALT, ALKPHOS, BILITOT, PROT, ALBUMIN in the last 168 hours. No results for input(s): LIPASE, AMYLASE in the last 168 hours. No results for input(s): AMMONIA in the last 168 hours. Coagulation Profile: No results for input(s): INR, PROTIME in the last 168 hours. Cardiac Enzymes:  Recent Labs Lab 10/17/16 1644 10/18/16 0507 10/18/16 0803  TROPONINI <0.03 <0.03 <0.03   BNP (last 3 results) No results for input(s): PROBNP in the last 8760 hours. HbA1C: No results for input(s): HGBA1C in the last 72 hours. CBG: No results for input(s): GLUCAP in the last  168 hours. Lipid Profile:  Recent Labs  10/18/16 0507  CHOL 165  HDL 31*  LDLCALC 95  TRIG 119*  CHOLHDL 5.3   Thyroid Function Tests: No results for input(s): TSH, T4TOTAL, FREET4, T3FREE, THYROIDAB in the last 72 hours. Anemia Panel: No results for input(s): VITAMINB12, FOLATE, FERRITIN, TIBC, IRON, RETICCTPCT in the last 72 hours. Sepsis Labs: No results for input(s): PROCALCITON, LATICACIDVEN in the last 168 hours.  No results found for this or any previous visit (from the past 240 hour(s)).   Radiology Studies: Dg Chest 2 View  Result Date: 10/18/2016 CLINICAL DATA:  Chest pain and dizziness. EXAM: CHEST  2 VIEW COMPARISON:  Radiographs yesterday at 1437 hour FINDINGS: The cardiomediastinal contours are normal. Stable hyperinflation. Pulmonary vasculature is  normal. No consolidation, pleural effusion, or pneumothorax. No acute osseous abnormalities are seen. IMPRESSION: Stable hyperinflation. No acute abnormality, no change from prior exam. Electronically Signed   By: Rubye Oaks M.D.   On: 10/18/2016 01:37   Nm Myocar Multi W/spect W/wall Motion / Ef  Result Date: 10/19/2016 CLINICAL DATA:  Chest pain EXAM: MYOCARDIAL IMAGING WITH SPECT (REST AND EXERCISE) GATED LEFT VENTRICULAR WALL MOTION STUDY LEFT VENTRICULAR EJECTION FRACTION TECHNIQUE: Standard myocardial SPECT imaging was performed after resting intravenous injection of 10 mCi Tc-43m tetrofosmin. Subsequently, exercise tolerance test was performed by the patient under the supervision of the Cardiology staff. At peak-stress, 30 mCi Tc-30m tetrofosmin was injected intravenously and standard myocardial SPECT imaging was performed. Quantitative gated imaging was also performed to evaluate left ventricular wall motion, and estimate left ventricular ejection fraction. COMPARISON:  None. FINDINGS: Perfusion: There are multiple stress-induced perfusion defects. Baker in the anterior septum, inferior wall, and inferolateral walls. At the anterior apex, the size is small. The conglomerate defect in the inferior and inferolateral walls is large. The defect in the inferolateral wall is partially re- perfused. Wall Motion: Normal left ventricular wall motion. No left ventricular dilation. Left Ventricular Ejection Fraction: 59 % End diastolic volume 109 ml End systolic volume 44 ml IMPRESSION: 1. There are stress-induced perfusion defects in the anteroseptal wall, inferolateral wall, and inferior wall. 2. Normal left ventricular wall motion. 3. Left ventricular ejection fraction 59% 4. Non invasive risk stratification*: High risk. *2012 Appropriate Use Criteria for Coronary Revascularization Focused Update: J Am Coll Cardiol. 2012;59(9):857-881. http://content.dementiazones.com.aspx?articleid=1201161  Electronically Signed   By: Jolaine Click M.D.   On: 10/19/2016 13:43    Scheduled Meds: . aspirin EC  162 mg Oral Daily  . enoxaparin (LOVENOX) injection  40 mg Subcutaneous Daily  . losartan  50 mg Oral Daily   Continuous Infusions:   LOS: 0 days    Time spent: > 25 minutes  Penny Pia, MD [facility] Pager [phone]  If 7PM-7AM, please contact night-coverage [email or web address] Password [facility] 10/19/2016, 6:19 PM

## 2016-10-19 NOTE — Progress Notes (Signed)
Pt stated earlier this evening that a cardiology PA informed he would go to cath lab tomorrow. No orders placed. Education initiated --pt shown cardiac cath/intervention education channel video.

## 2016-10-20 ENCOUNTER — Encounter (HOSPITAL_COMMUNITY): Payer: Self-pay | Admitting: Cardiology

## 2016-10-20 ENCOUNTER — Encounter (HOSPITAL_COMMUNITY)
Admission: EM | Disposition: A | Discharge status: Home / Self Care | Payer: Self-pay | Source: Home / Self Care | Attending: Emergency Medicine

## 2016-10-20 DIAGNOSIS — I10 Essential (primary) hypertension: Secondary | ICD-10-CM | POA: Diagnosis not present

## 2016-10-20 DIAGNOSIS — R0789 Other chest pain: Secondary | ICD-10-CM | POA: Diagnosis not present

## 2016-10-20 DIAGNOSIS — I200 Unstable angina: Secondary | ICD-10-CM

## 2016-10-20 DIAGNOSIS — Z7982 Long term (current) use of aspirin: Secondary | ICD-10-CM | POA: Diagnosis not present

## 2016-10-20 DIAGNOSIS — R002 Palpitations: Secondary | ICD-10-CM | POA: Diagnosis not present

## 2016-10-20 DIAGNOSIS — R079 Chest pain, unspecified: Secondary | ICD-10-CM

## 2016-10-20 DIAGNOSIS — R9439 Abnormal result of other cardiovascular function study: Secondary | ICD-10-CM

## 2016-10-20 HISTORY — PX: LEFT HEART CATH AND CORONARY ANGIOGRAPHY: CATH118249

## 2016-10-20 LAB — PROTIME-INR
INR: 1.07
PROTHROMBIN TIME: 13.9 s (ref 11.4–15.2)

## 2016-10-20 SURGERY — LEFT HEART CATH AND CORONARY ANGIOGRAPHY
Anesthesia: LOCAL

## 2016-10-20 MED ORDER — VERAPAMIL HCL 2.5 MG/ML IV SOLN
INTRAVENOUS | Status: AC
  Filled 2016-10-20: qty 2

## 2016-10-20 MED ORDER — IOPAMIDOL (ISOVUE-370) INJECTION 76%
INTRAVENOUS | Status: AC
  Filled 2016-10-20: qty 100

## 2016-10-20 MED ORDER — FENTANYL CITRATE (PF) 100 MCG/2ML IJ SOLN
INTRAMUSCULAR | Status: DC | PRN
  Administered 2016-10-20: 25 ug via INTRAVENOUS

## 2016-10-20 MED ORDER — SODIUM CHLORIDE 0.9 % WEIGHT BASED INFUSION: 1.0000 mL/kg/h | INTRAVENOUS | Status: DC

## 2016-10-20 MED ORDER — IOPAMIDOL (ISOVUE-370) INJECTION 76%
INTRAVENOUS | Status: DC | PRN
  Administered 2016-10-20: 50 mL via INTRA_ARTERIAL

## 2016-10-20 MED ORDER — NITROGLYCERIN IN D5W 200-5 MCG/ML-% IV SOLN
0.0000 ug/min | INTRAVENOUS | Status: DC
  Administered 2016-10-20: 5 ug/min via INTRAVENOUS
  Filled 2016-10-20: qty 250

## 2016-10-20 MED ORDER — VERAPAMIL HCL 2.5 MG/ML IV SOLN
INTRAVENOUS | Status: DC | PRN
  Administered 2016-10-20: 10 mL via INTRA_ARTERIAL

## 2016-10-20 MED ORDER — PANTOPRAZOLE SODIUM 40 MG PO TBEC: 40.0000 mg | DELAYED_RELEASE_TABLET | Freq: Every day | ORAL | 0 refills | Status: DC

## 2016-10-20 MED ORDER — HEPARIN (PORCINE) IN NACL 2-0.9 UNIT/ML-% IJ SOLN
INTRAMUSCULAR | Status: AC
  Filled 2016-10-20: qty 1000

## 2016-10-20 MED ORDER — MIDAZOLAM HCL 2 MG/2ML IJ SOLN
INTRAMUSCULAR | Status: AC
  Filled 2016-10-20: qty 2

## 2016-10-20 MED ORDER — LIDOCAINE HCL (PF) 1 % IJ SOLN
INTRAMUSCULAR | Status: AC
  Filled 2016-10-20: qty 30

## 2016-10-20 MED ORDER — SODIUM CHLORIDE 0.9% FLUSH: 3.0000 mL | INTRAVENOUS | Status: DC | PRN

## 2016-10-20 MED ORDER — MIDAZOLAM HCL 2 MG/2ML IJ SOLN
INTRAMUSCULAR | Status: DC | PRN
  Administered 2016-10-20: 1 mg via INTRAVENOUS

## 2016-10-20 MED ORDER — HEPARIN (PORCINE) IN NACL 2-0.9 UNIT/ML-% IJ SOLN
INTRAMUSCULAR | Status: AC | PRN
  Administered 2016-10-20: 1000 mL

## 2016-10-20 MED ORDER — HEPARIN SODIUM (PORCINE) 1000 UNIT/ML IJ SOLN
INTRAMUSCULAR | Status: DC | PRN
Start: 2016-10-20 — End: 2016-10-20
  Administered 2016-10-20: 5000 [IU] via INTRAVENOUS

## 2016-10-20 MED ORDER — FENTANYL CITRATE (PF) 100 MCG/2ML IJ SOLN
INTRAMUSCULAR | Status: AC
  Filled 2016-10-20: qty 2

## 2016-10-20 MED ORDER — SODIUM CHLORIDE 0.9 % WEIGHT BASED INFUSION
3.0000 mL/kg/h | INTRAVENOUS | Status: DC
  Administered 2016-10-20: 3 mL/kg/h via INTRAVENOUS

## 2016-10-20 MED ORDER — LIDOCAINE HCL (PF) 1 % IJ SOLN
INTRAMUSCULAR | Status: DC | PRN
  Administered 2016-10-20: 1 mL via INTRADERMAL

## 2016-10-20 MED ORDER — SODIUM CHLORIDE 0.9 % IV SOLN: 250.0000 mL | INTRAVENOUS | Status: DC | PRN

## 2016-10-20 MED ORDER — PANTOPRAZOLE SODIUM 40 MG PO TBEC
40.0000 mg | DELAYED_RELEASE_TABLET | Freq: Every day | ORAL | Status: DC
  Administered 2016-10-20: 40 mg via ORAL
  Filled 2016-10-20: qty 1

## 2016-10-20 MED ORDER — HEPARIN SODIUM (PORCINE) 1000 UNIT/ML IJ SOLN
INTRAMUSCULAR | Status: AC
  Filled 2016-10-20: qty 1

## 2016-10-20 MED ORDER — SODIUM CHLORIDE 0.9% FLUSH: 3.0000 mL | Freq: Two times a day (BID) | INTRAVENOUS | Status: DC

## 2016-10-20 SURGICAL SUPPLY — 11 items
CATH INFINITI 5 FR JL3.5 (CATHETERS) ×2 IMPLANT
CATH INFINITI 5FR ANG PIGTAIL (CATHETERS) ×2 IMPLANT
CATH INFINITI JR4 5F (CATHETERS) ×2 IMPLANT
DEVICE RAD COMP TR BAND LRG (VASCULAR PRODUCTS) ×2 IMPLANT
GLIDESHEATH SLEND SS 6F .021 (SHEATH) ×2 IMPLANT
GUIDEWIRE INQWIRE 1.5J.035X260 (WIRE) ×1 IMPLANT
INQWIRE 1.5J .035X260CM (WIRE) ×2
KIT HEART LEFT (KITS) ×2 IMPLANT
PACK CARDIAC CATHETERIZATION (CUSTOM PROCEDURE TRAY) ×2 IMPLANT
TRANSDUCER W/STOPCOCK (MISCELLANEOUS) ×2 IMPLANT
TUBING CIL FLEX 10 FLL-RA (TUBING) ×2 IMPLANT

## 2016-10-20 NOTE — Discharge Summary (Signed)
Physician Discharge Summary  Marco Vazquez ZOX:[id number] DOB: 1956-02-04 DOA: 10/18/2016  PCP: Elias Elseeade, Robert, MD  Admit date: 10/18/2016 Discharge date: 10/20/2016  Time spent: > 35  minutes  Recommendations for Outpatient Follow-up:  1. Patient was evaluated by cardiology and had negative heart cath normal coronary anatomy normal LVEDP 2. Should patient continue to have chest discomfort please evaluate for other causes   Discharge Diagnoses:  Principal Problem:   Chest pain Active Problems:   Essential hypertension   Discharge Condition: stable  Diet recommendation: Heart healthy  Filed Weights   10/18/16 0040 10/18/16 0610  Weight: 104.3 kg (230 lb) 105.4 kg (232 lb 4.8 oz)    History of present illness:  61 year old presenting with chest pain. Ruled out but had high risk stress test. Subsequently taken for cardiac cath which was negative.  Hospital Course:  Chest pain -Please see above. Workup with heart cath negative - We'll try trial of Protonix on discharge and provide prescription.  For known medical problems listed above will continue medication regimen listed below  Procedures:  Cardiac cath please see note for details  Consultations:  Cardiology  Discharge Exam: Vitals:   10/20/16 1303 10/20/16 1309  BP:  119/71  Pulse: (!) 0 64  Resp: (!) 0 18  Temp:  97.9 F (36.6 C)    General: Patient in no acute distress, alert and awake Cardiovascular: No cyanosis Respiratory: No increased work of breathing, no wheezes  Discharge Instructions   Discharge Instructions    Call MD for:  severe uncontrolled pain    Complete by:  As directed    Call MD for:  temperature >100.4    Complete by:  As directed    Diet - low sodium heart healthy    Complete by:  As directed    Discharge instructions    Complete by:  As directed    Please be sure to follow up with your primary care physician for further evaluation and recommendations.   Increase activity slowly     Complete by:  As directed      Current Discharge Medication List    START taking these medications   Details  pantoprazole (PROTONIX) 40 MG tablet Take 1 tablet (40 mg total) by mouth daily. Qty: 30 tablet, Refills: 0      CONTINUE these medications which have NOT CHANGED   Details  aspirin EC 81 MG tablet Take 81 mg by mouth daily.     Cyanocobalamin (VITAMIN B 12 PO) Take 1 tablet by mouth daily.    losartan (COZAAR) 50 MG tablet Take 50 mg by mouth daily.    Melatonin 5 MG TABS Take 5 mg by mouth at bedtime.    Multiple Vitamins-Minerals (MULTIVITAMIN WITH MINERALS) tablet Take 1 tablet by mouth daily.       Allergies  Allergen Reactions  . Contrast Media [Iodinated Diagnostic Agents]     "started sneezing "  . Sulfa Antibiotics Other (See Comments)    "everything speeds up"      The results of significant diagnostics from this hospitalization (including imaging, microbiology, ancillary and laboratory) are listed below for reference.    Significant Diagnostic Studies: Dg Chest 2 View  Result Date: 10/18/2016 CLINICAL DATA:  Chest pain and dizziness. EXAM: CHEST  2 VIEW COMPARISON:  Radiographs yesterday at 1437 hour FINDINGS: The cardiomediastinal contours are normal. Stable hyperinflation. Pulmonary vasculature is normal. No consolidation, pleural effusion, or pneumothorax. No acute osseous abnormalities are seen. IMPRESSION: Stable hyperinflation. No acute  abnormality, no change from prior exam. Electronically Signed   By: Rubye Oaks M.D.   On: 10/18/2016 01:37   Dg Chest 2 View  Result Date: 10/17/2016 CLINICAL DATA:  Chest pain, dizziness, shortness of breath EXAM: CHEST  2 VIEW COMPARISON:  None available FINDINGS: Mild hyperinflation without focal pneumonia, collapse or consolidation. Negative for edema, effusion or pneumothorax. Trachea is midline. Normal heart size and vascularity. No acute osseous finding. Monitor leads overlie the chest. IMPRESSION:  Mild hyperinflation.  No acute process. Electronically Signed   By: Judie Petit.  Shick M.D.   On: 10/17/2016 14:47   Nm Myocar Multi W/spect W/wall Motion / Ef  Result Date: 10/19/2016 CLINICAL DATA:  Chest pain EXAM: MYOCARDIAL IMAGING WITH SPECT (REST AND EXERCISE) GATED LEFT VENTRICULAR WALL MOTION STUDY LEFT VENTRICULAR EJECTION FRACTION TECHNIQUE: Standard myocardial SPECT imaging was performed after resting intravenous injection of 10 mCi Tc-81m tetrofosmin. Subsequently, exercise tolerance test was performed by the patient under the supervision of the Cardiology staff. At peak-stress, 30 mCi Tc-85m tetrofosmin was injected intravenously and standard myocardial SPECT imaging was performed. Quantitative gated imaging was also performed to evaluate left ventricular wall motion, and estimate left ventricular ejection fraction. COMPARISON:  None. FINDINGS: Perfusion: There are multiple stress-induced perfusion defects. Baker in the anterior septum, inferior wall, and inferolateral walls. At the anterior apex, the size is small. The conglomerate defect in the inferior and inferolateral walls is large. The defect in the inferolateral wall is partially re- perfused. Wall Motion: Normal left ventricular wall motion. No left ventricular dilation. Left Ventricular Ejection Fraction: 59 % End diastolic volume 109 ml End systolic volume 44 ml IMPRESSION: 1. There are stress-induced perfusion defects in the anteroseptal wall, inferolateral wall, and inferior wall. 2. Normal left ventricular wall motion. 3. Left ventricular ejection fraction 59% 4. Non invasive risk stratification*: High risk. *2012 Appropriate Use Criteria for Coronary Revascularization Focused Update: J Am Coll Cardiol. 2012;59(9):857-881. http://content.dementiazones.com.aspx?articleid=1201161 Electronically Signed   By: Jolaine Click M.D.   On: 10/19/2016 13:43    Microbiology: No results found for this or any previous visit (from the past 240  hour(s)).   Labs: Basic Metabolic Panel:  Recent Labs Lab 10/17/16 1336 10/18/16 0041  NA 137 137  K 3.8 3.6  CL 105 103  CO2 23 26  GLUCOSE 147* 94  BUN 15 14  CREATININE 1.10 1.03  CALCIUM 9.2 8.9   Liver Function Tests: No results for input(s): AST, ALT, ALKPHOS, BILITOT, PROT, ALBUMIN in the last 168 hours. No results for input(s): LIPASE, AMYLASE in the last 168 hours. No results for input(s): AMMONIA in the last 168 hours. CBC:  Recent Labs Lab 10/17/16 1336 10/18/16 0041  WBC 8.9 8.5  HGB 17.8* 16.7  HCT 50.9 49.4  MCV 89.1 89.7  PLT 185 164   Cardiac Enzymes:  Recent Labs Lab 10/17/16 1644 10/18/16 0507 10/18/16 0803  TROPONINI <0.03 <0.03 <0.03   BNP: BNP (last 3 results) No results for input(s): BNP in the last 8760 hours.  ProBNP (last 3 results) No results for input(s): PROBNP in the last 8760 hours.  CBG: No results for input(s): GLUCAP in the last 168 hours.     Signed:  Penny Pia MD.  [facility] 10/20/2016, 2:01 PM

## 2016-10-20 NOTE — Progress Notes (Signed)
Attempted to remove air from TR band per protocol. Began bleeding. 3 cc of air reinserted. Total 13 cc of air in TR band. Pt educated as to plan of care. Will continue to monitor.   Leonidas Rombergaitlin S Bumbledare, RN

## 2016-10-20 NOTE — Interval H&P Note (Signed)
History and Physical Interval Note:  10/20/2016 12:15 PM  Marco Vazquez  has presented today for surgery, with the diagnosis of unstable angina  The various methods of treatment have been discussed with the patient and family. After consideration of risks, benefits and other options for treatment, the patient has consented to  Procedure(s): Left Heart Cath and Coronary Angiography (N/A) as a surgical intervention .  The patient's history has been reviewed, patient examined, no change in status, stable for surgery.  I have reviewed the patient's chart and labs.  Questions were answered to the patient's satisfaction.   Cath Lab Visit (complete for each Cath Lab visit)  Clinical Evaluation Leading to the Procedure:   ACS: Yes.    Non-ACS:    Anginal Classification: CCS IV  Anti-ischemic medical therapy: Minimal Therapy (1 class of medications)  Non-Invasive Test Results: High-risk stress test findings: cardiac mortality >3%/year  Prior CABG: No previous CABG        Theron Aristaeter [facility] HospitalJordanMD,FACC 10/20/2016 12:15 PM

## 2016-10-20 NOTE — Progress Notes (Signed)
Discussed with the patient and all questioned fully answered. He will call me if any problems arise.\  IV removed. Telemetry removed. Pt verbalized understanding of all discharge instructions.   Leonidas Rombergaitlin S Bumbledare, RN

## 2016-10-20 NOTE — Progress Notes (Signed)
Progress Note  Patient Name: Marco Vazquez Date of Encounter: 10/20/2016  [facility] Cardiologist[facility] Mayford Knifeurner)  Subjective   Had chest pain overnight (ressovled with IV morphine) and this morning (resolved with SL nitro x 1).   Inpatient Medications    Scheduled Meds: . aspirin EC  162 mg Oral Daily  . enoxaparin (LOVENOX) injection  40 mg Subcutaneous Daily  . losartan  50 mg Oral Daily   Continuous Infusions: . nitroGLYCERIN     PRN Meds: acetaminophen, gi cocktail, morphine injection, nitroGLYCERIN, ondansetron (ZOFRAN) IV, zolpidem   Vital Signs    Vitals:   10/20/16 0245 10/20/16 0522 10/20/16 0700 10/20/16 0705  BP: 129/80 116/72 137/76 127/77  Pulse: 64 60 64 67  Resp: 18 18 18 18   Temp: 98.2 F (36.8 C) 97.8 F (36.6 C)    TempSrc: Oral Oral    SpO2: 96% 95% 99% 98%  Weight:      Height:        Intake/Output Summary (Last 24 hours) at 10/20/16 0749 Last data filed at 10/19/16 1720  Gross per 24 hour  Intake              480 ml  Output                1 ml  Net              479 ml   Filed Weights   10/18/16 0040 10/18/16 0610  Weight: 230 lb (104.3 kg) 232 lb 4.8 oz (105.4 kg)    Telemetry    NSR - Personally Reviewed  ECG    NSr - Personally Reviewed  Physical Exam   GEN: No acute distress.   Neck: No JVD Cardiac: RRR, no murmurs, rubs, or gallops.  Respiratory: Clear to auscultation bilaterally. GI: Soft, nontender, non-distended  MS: No edema; No deformity. Neuro:  Nonfocal  Psych: Normal affect   Labs    Chemistry Recent Labs Lab 10/17/16 1336 10/18/16 0041  NA 137 137  K 3.8 3.6  CL 105 103  CO2 23 26  GLUCOSE 147* 94  BUN 15 14  CREATININE 1.10 1.03  CALCIUM 9.2 8.9  GFRNONAA >60 >60  GFRAA >60 >60  ANIONGAP 9 8     Hematology Recent Labs Lab 10/17/16 1336 10/18/16 0041  WBC 8.9 8.5  RBC 5.71 5.51  HGB 17.8* 16.7  HCT 50.9 49.4  MCV 89.1 89.7  MCH 31.2 30.3  MCHC 35.0 33.8  RDW 13.0 13.1    PLT 185 164    Cardiac Enzymes Recent Labs Lab 10/17/16 1644 10/18/16 0507 10/18/16 0803  TROPONINI <0.03 <0.03 <0.03    Recent Labs Lab 10/17/16 1356 10/18/16 0054 10/18/16 0357  TROPIPOC 0.01 0.00 0.00     BNPNo results for input(s): BNP, PROBNP in the last 168 hours.   DDimer  Recent Labs Lab 10/17/16 1644  DDIMER 0.50     Radiology    Nm Myocar Multi W/spect W/wall Motion / Ef  Result Date: 10/19/2016 CLINICAL DATA:  Chest pain EXAM: MYOCARDIAL IMAGING WITH SPECT (REST AND EXERCISE) GATED LEFT VENTRICULAR WALL MOTION STUDY LEFT VENTRICULAR EJECTION FRACTION TECHNIQUE: Standard myocardial SPECT imaging was performed after resting intravenous injection of 10 mCi Tc-3171m tetrofosmin. Subsequently, exercise tolerance test was performed by the patient under the supervision of the Cardiology staff. At peak-stress, 30 mCi Tc-771m tetrofosmin was injected intravenously and standard myocardial SPECT imaging was performed. Quantitative gated imaging was also performed to  evaluate left ventricular wall motion, and estimate left ventricular ejection fraction. COMPARISON:  None. FINDINGS: Perfusion: There are multiple stress-induced perfusion defects. Baker in the anterior septum, inferior wall, and inferolateral walls. At the anterior apex, the size is small. The conglomerate defect in the inferior and inferolateral walls is large. The defect in the inferolateral wall is partially re- perfused. Wall Motion: Normal left ventricular wall motion. No left ventricular dilation. Left Ventricular Ejection Fraction: 59 % End diastolic volume 109 ml End systolic volume 44 ml IMPRESSION: 1. There are stress-induced perfusion defects in the anteroseptal wall, inferolateral wall, and inferior wall. 2. Normal left ventricular wall motion. 3. Left ventricular ejection fraction 59% 4. Non invasive risk stratification*: High risk. *2012 Appropriate Use Criteria for Coronary Revascularization Focused Update:  J Am Coll Cardiol. 2012;59(9):857-881. http://content.dementiazones.com.aspx?articleid=1201161 Electronically Signed   By: Jolaine Click M.D.   On: 10/19/2016 13:43    Cardiac Studies   Myoview 10/19/16 1. There are stress-induced perfusion defects in the anteroseptal wall, inferolateral wall, and inferior wall.  2. Normal left ventricular wall motion.  3. Left ventricular ejection fraction 59%  4. Non invasive risk stratification*: High risk.  Patient Profile     Marco Vazquez is a 61 y.o. male with a history of HTN and possible family hx of CAD who presented to [facility] ED on 10/17/16 with chest pain. He ruled out and was discharged home from the ED. He then returned on 10/18/16 with recurrent chest pain and palpitations and was admitted for further work up and evaluation.  Assessment & Plan    Chest pain: he has ruled out for MI. 2D ECHO with normal LV function and no WMAs. Stress test is high risk. Two episode of chest pain overnight. Will restart IV nitro. EKG without acute changes this morning. For cath later today. Will given meds for IV contrast allergy.   The patient understands that risks include but are not limited to stroke (1 in 1000), death (1 in 1000), kidney failure [usually temporary] (1 in 500), bleeding (1 in 200), allergic reaction [possibly serious] (1 in 200), and agrees to proceed.   Palpitations: felt to be 2/2 to a recent steroid taper for an ear infection. Tele has been unremarkable so far. We may consider an outpatient monitor.  HTN: BP well controlled at this time.   Lorelei Pont, PA  10/20/2016, 7:49 AM

## 2016-10-20 NOTE — Progress Notes (Signed)
PROGRESS NOTE    Marco Vazquez  [id number] DOB: 01-15-1956 DOA: 10/18/2016 PCP: Elias Elseeade, Robert, MD    Brief Narrative:   61 y.o. male with medical history significant of HTN, who presents with intermittent left-sided chest pain since yesterday.  He describes the pain as pressure-like and reported numbness/ tingling pain in the left arm.  Stress test came back high risk. Cardiology planning further work up   Assessment & Plan:   Principal Problem:   Chest pain - Cardiology on board and plan is for cardiac cath today. Otherwise no new complaints.  Active Problems:   Essential hypertension -  On losartan, stable  DVT prophylaxis: Lovenox Code Status: Full Family Communication: d/c spouse Disposition Plan: Will see if cardiology will take over care given that patient is taken for cardiac cath. If not will await their clearance for discharge.   Consultants:   Cardiology   Procedures: myoview, heart cath   Antimicrobials: none   Subjective: Pt has no new complaints.   Objective: Vitals:   10/20/16 1253 10/20/16 1258 10/20/16 1303 10/20/16 1309  BP:    119/71  Pulse: (!) 0 (!) 140 (!) 0 64  Resp: (!) 0 (!) 0 (!) 0 18  Temp:    97.9 F (36.6 C)  TempSrc:    Oral  SpO2: (!) 0% (!) 0% (!) 0% 94%  Weight:      Height:        Intake/Output Summary (Last 24 hours) at 10/20/16 1338 Last data filed at 10/19/16 1720  Gross per 24 hour  Intake              240 ml  Output                0 ml  Net              240 ml   Filed Weights   10/18/16 0040 10/18/16 0610  Weight: 104.3 kg (230 lb) 105.4 kg (232 lb 4.8 oz)    Examination:  General exam: Appears calm and comfortable, in nad. Respiratory system: Clear to auscultation. Respiratory effort normal. Cardiovascular system: S1 & S2 heard, RRR.  Gastrointestinal system: Abdomen is nondistended, soft and nontender. No organomegaly or masses felt. Normal bowel sounds heard. Central nervous system: Alert and  oriented. No focal neurological deficits. Extremities: Symmetric 5 x 5 power. Skin: No rashes, lesions or ulcers, on limited exam. Psychiatry: Mood & affect appropriate.   Data Reviewed: I have personally reviewed following labs and imaging studies  CBC:  Recent Labs Lab 10/17/16 1336 10/18/16 0041  WBC 8.9 8.5  HGB 17.8* 16.7  HCT 50.9 49.4  MCV 89.1 89.7  PLT 185 164   Basic Metabolic Panel:  Recent Labs Lab 10/17/16 1336 10/18/16 0041  NA 137 137  K 3.8 3.6  CL 105 103  CO2 23 26  GLUCOSE 147* 94  BUN 15 14  CREATININE 1.10 1.03  CALCIUM 9.2 8.9   GFR: Estimated Creatinine Clearance: 96 mL/min (by C-G formula based on SCr of 1.03 mg/dL). Liver Function Tests: No results for input(s): AST, ALT, ALKPHOS, BILITOT, PROT, ALBUMIN in the last 168 hours. No results for input(s): LIPASE, AMYLASE in the last 168 hours. No results for input(s): AMMONIA in the last 168 hours. Coagulation Profile:  Recent Labs Lab 10/20/16 0644  INR 1.07   Cardiac Enzymes:  Recent Labs Lab 10/17/16 1644 10/18/16 0507 10/18/16 0803  TROPONINI <0.03 <0.03 <0.03   BNP (last 3 results) No  results for input(s): PROBNP in the last 8760 hours. HbA1C: No results for input(s): HGBA1C in the last 72 hours. CBG: No results for input(s): GLUCAP in the last 168 hours. Lipid Profile:  Recent Labs  10/18/16 0507  CHOL 165  HDL 31*  LDLCALC 95  TRIG 272*  CHOLHDL 5.3   Thyroid Function Tests: No results for input(s): TSH, T4TOTAL, FREET4, T3FREE, THYROIDAB in the last 72 hours. Anemia Panel: No results for input(s): VITAMINB12, FOLATE, FERRITIN, TIBC, IRON, RETICCTPCT in the last 72 hours. Sepsis Labs: No results for input(s): PROCALCITON, LATICACIDVEN in the last 168 hours.  No results found for this or any previous visit (from the past 240 hour(s)).   Radiology Studies: Nm Myocar Multi W/spect W/wall Motion / Ef  Result Date: 10/19/2016 CLINICAL DATA:  Chest pain EXAM:  MYOCARDIAL IMAGING WITH SPECT (REST AND EXERCISE) GATED LEFT VENTRICULAR WALL MOTION STUDY LEFT VENTRICULAR EJECTION FRACTION TECHNIQUE: Standard myocardial SPECT imaging was performed after resting intravenous injection of 10 mCi Tc-72m tetrofosmin. Subsequently, exercise tolerance test was performed by the patient under the supervision of the Cardiology staff. At peak-stress, 30 mCi Tc-42m tetrofosmin was injected intravenously and standard myocardial SPECT imaging was performed. Quantitative gated imaging was also performed to evaluate left ventricular wall motion, and estimate left ventricular ejection fraction. COMPARISON:  None. FINDINGS: Perfusion: There are multiple stress-induced perfusion defects. Baker in the anterior septum, inferior wall, and inferolateral walls. At the anterior apex, the size is small. The conglomerate defect in the inferior and inferolateral walls is large. The defect in the inferolateral wall is partially re- perfused. Wall Motion: Normal left ventricular wall motion. No left ventricular dilation. Left Ventricular Ejection Fraction: 59 % End diastolic volume 109 ml End systolic volume 44 ml IMPRESSION: 1. There are stress-induced perfusion defects in the anteroseptal wall, inferolateral wall, and inferior wall. 2. Normal left ventricular wall motion. 3. Left ventricular ejection fraction 59% 4. Non invasive risk stratification*: High risk. *2012 Appropriate Use Criteria for Coronary Revascularization Focused Update: J Am Coll Cardiol. 2012;59(9):857-881. http://content.dementiazones.com.aspx?articleid=1201161 Electronically Signed   By: Jolaine Click M.D.   On: 10/19/2016 13:43    Scheduled Meds: . aspirin EC  162 mg Oral Daily  . enoxaparin (LOVENOX) injection  40 mg Subcutaneous Daily  . losartan  50 mg Oral Daily   Continuous Infusions: . nitroGLYCERIN 5 mcg/min (10/20/16 0755)     LOS: 1 day    Time spent: > 25 minutes  Penny Pia, MD [facility] Pager [phone]  If 7PM-7AM, please contact night-coverage [email or web address] Password [email or web address] 10/20/2016, 1:38 PM

## 2016-10-20 NOTE — H&P (View-Only) (Signed)
Progress Note  Patient Name: Marco Vazquez Date of Encounter: 10/20/2016  Primary Cardiologist: [facility] (Dr. Mayford Knifeurner)  Subjective   Had chest pain overnight (ressovled with IV morphine) and this morning (resolved with SL nitro x 1).   Inpatient Medications    Scheduled Meds: . aspirin EC  162 mg Oral Daily  . enoxaparin (LOVENOX) injection  40 mg Subcutaneous Daily  . losartan  50 mg Oral Daily   Continuous Infusions: . nitroGLYCERIN     PRN Meds: acetaminophen, gi cocktail, morphine injection, nitroGLYCERIN, ondansetron (ZOFRAN) IV, zolpidem   Vital Signs    Vitals:   10/20/16 0245 10/20/16 0522 10/20/16 0700 10/20/16 0705  BP: 129/80 116/72 137/76 127/77  Pulse: 64 60 64 67  Resp: 18 18 18 18   Temp: 98.2 F (36.8 C) 97.8 F (36.6 C)    TempSrc: Oral Oral    SpO2: 96% 95% 99% 98%  Weight:      Height:        Intake/Output Summary (Last 24 hours) at 10/20/16 0749 Last data filed at 10/19/16 1720  Gross per 24 hour  Intake              480 ml  Output                1 ml  Net              479 ml   Filed Weights   10/18/16 0040 10/18/16 0610  Weight: 230 lb (104.3 kg) 232 lb 4.8 oz (105.4 kg)    Telemetry    NSR - Personally Reviewed  ECG    NSr - Personally Reviewed  Physical Exam   GEN: No acute distress.   Neck: No JVD Cardiac: RRR, no murmurs, rubs, or gallops.  Respiratory: Clear to auscultation bilaterally. GI: Soft, nontender, non-distended  MS: No edema; No deformity. Neuro:  Nonfocal  Psych: Normal affect   Labs    Chemistry Recent Labs Lab 10/17/16 1336 10/18/16 0041  NA 137 137  K 3.8 3.6  CL 105 103  CO2 23 26  GLUCOSE 147* 94  BUN 15 14  CREATININE 1.10 1.03  CALCIUM 9.2 8.9  GFRNONAA >60 >60  GFRAA >60 >60  ANIONGAP 9 8     Hematology Recent Labs Lab 10/17/16 1336 10/18/16 0041  WBC 8.9 8.5  RBC 5.71 5.51  HGB 17.8* 16.7  HCT 50.9 49.4  MCV 89.1 89.7  MCH 31.2 30.3  MCHC 35.0 33.8  RDW 13.0 13.1    PLT 185 164    Cardiac Enzymes Recent Labs Lab 10/17/16 1644 10/18/16 0507 10/18/16 0803  TROPONINI <0.03 <0.03 <0.03    Recent Labs Lab 10/17/16 1356 10/18/16 0054 10/18/16 0357  TROPIPOC 0.01 0.00 0.00     BNPNo results for input(s): BNP, PROBNP in the last 168 hours.   DDimer  Recent Labs Lab 10/17/16 1644  DDIMER 0.50     Radiology    Nm Myocar Multi W/spect W/wall Motion / Ef  Result Date: 10/19/2016 CLINICAL DATA:  Chest pain EXAM: MYOCARDIAL IMAGING WITH SPECT (REST AND EXERCISE) GATED LEFT VENTRICULAR WALL MOTION STUDY LEFT VENTRICULAR EJECTION FRACTION TECHNIQUE: Standard myocardial SPECT imaging was performed after resting intravenous injection of 10 mCi Tc-3171m tetrofosmin. Subsequently, exercise tolerance test was performed by the patient under the supervision of the Cardiology staff. At peak-stress, 30 mCi Tc-771m tetrofosmin was injected intravenously and standard myocardial SPECT imaging was performed. Quantitative gated imaging was also performed to  evaluate left ventricular wall motion, and estimate left ventricular ejection fraction. COMPARISON:  None. FINDINGS: Perfusion: There are multiple stress-induced perfusion defects. Baker in the anterior septum, inferior wall, and inferolateral walls. At the anterior apex, the size is small. The conglomerate defect in the inferior and inferolateral walls is large. The defect in the inferolateral wall is partially re- perfused. Wall Motion: Normal left ventricular wall motion. No left ventricular dilation. Left Ventricular Ejection Fraction: 59 % End diastolic volume 109 ml End systolic volume 44 ml IMPRESSION: 1. There are stress-induced perfusion defects in the anteroseptal wall, inferolateral wall, and inferior wall. 2. Normal left ventricular wall motion. 3. Left ventricular ejection fraction 59% 4. Non invasive risk stratification*: High risk. *2012 Appropriate Use Criteria for Coronary Revascularization Focused Update:  J Am Coll Cardiol. 2012;59(9):857-881. http://content.dementiazones.com.aspx?articleid=1201161 Electronically Signed   By: Jolaine Click M.D.   On: 10/19/2016 13:43    Cardiac Studies   Myoview 10/19/16 1. There are stress-induced perfusion defects in the anteroseptal wall, inferolateral wall, and inferior wall.  2. Normal left ventricular wall motion.  3. Left ventricular ejection fraction 59%  4. Non invasive risk stratification*: High risk.  Patient Profile     Auston Halfmann is a 61 y.o. male with a history of HTN and possible family hx of CAD who presented to [facility] ED on 10/17/16 with chest pain. He ruled out and was discharged home from the ED. He then returned on 10/18/16 with recurrent chest pain and palpitations and was admitted for further work up and evaluation.  Assessment & Plan    Chest pain: he has ruled out for MI. 2D ECHO with normal LV function and no WMAs. Stress test is high risk. Two episode of chest pain overnight. Will restart IV nitro. EKG without acute changes this morning. For cath later today. Will given meds for IV contrast allergy.   The patient understands that risks include but are not limited to stroke (1 in 1000), death (1 in 1000), kidney failure [usually temporary] (1 in 500), bleeding (1 in 200), allergic reaction [possibly serious] (1 in 200), and agrees to proceed.   Palpitations: felt to be 2/2 to a recent steroid taper for an ear infection. Tele has been unremarkable so far. We may consider an outpatient monitor.  HTN: BP well controlled at this time.   Lorelei Pont, PA  10/20/2016, 7:49 AM

## 2017-05-07 ENCOUNTER — Other Ambulatory Visit: Payer: Self-pay

## 2017-05-07 ENCOUNTER — Encounter (HOSPITAL_COMMUNITY): Payer: Self-pay | Admitting: Emergency Medicine

## 2017-05-07 DIAGNOSIS — R1012 Left upper quadrant pain: Secondary | ICD-10-CM | POA: Diagnosis not present

## 2017-05-07 DIAGNOSIS — R1013 Epigastric pain: Secondary | ICD-10-CM | POA: Insufficient documentation

## 2017-05-07 DIAGNOSIS — Z7982 Long term (current) use of aspirin: Secondary | ICD-10-CM | POA: Diagnosis not present

## 2017-05-07 DIAGNOSIS — I10 Essential (primary) hypertension: Secondary | ICD-10-CM | POA: Diagnosis not present

## 2017-05-07 DIAGNOSIS — R1032 Left lower quadrant pain: Secondary | ICD-10-CM | POA: Insufficient documentation

## 2017-05-07 LAB — COMPREHENSIVE METABOLIC PANEL
ALBUMIN: 4.2 g/dL (ref 3.5–5.0)
ALK PHOS: 76 U/L (ref 38–126)
ALT: 31 U/L (ref 17–63)
AST: 27 U/L (ref 15–41)
Anion gap: 7 (ref 5–15)
BILIRUBIN TOTAL: 0.6 mg/dL (ref 0.3–1.2)
BUN: 12 mg/dL (ref 6–20)
CALCIUM: 8.6 mg/dL — AB (ref 8.9–10.3)
CO2: 26 mmol/L (ref 22–32)
CREATININE: 0.84 mg/dL (ref 0.61–1.24)
Chloride: 105 mmol/L (ref 101–111)
GFR calc Af Amer: >60 mL/min (ref >60)
GLUCOSE: 90 mg/dL (ref 65–99)
POTASSIUM: 4 mmol/L (ref 3.5–5.1)
Sodium: 138 mmol/L (ref 135–145)
TOTAL PROTEIN: 6.7 g/dL (ref 6.5–8.1)

## 2017-05-07 LAB — CBC
HCT: 46.2 % (ref 39.0–52.0)
Hemoglobin: 16.1 g/dL (ref 13.0–17.0)
MCH: 30.4 pg (ref 26.0–34.0)
MCHC: 34.8 g/dL (ref 30.0–36.0)
MCV: 87.3 fL (ref 78.0–100.0)
PLATELETS: 147 10*3/uL — AB (ref 150–400)
RBC: 5.29 MIL/uL (ref 4.22–5.81)
RDW: 13 % (ref 11.5–15.5)
WBC: 6 10*3/uL (ref 4.0–10.5)

## 2017-05-07 LAB — URINALYSIS, ROUTINE W REFLEX MICROSCOPIC
BILIRUBIN URINE: NEGATIVE
Glucose, UA: NEGATIVE mg/dL
Hgb urine dipstick: NEGATIVE
KETONES UR: NEGATIVE mg/dL
LEUKOCYTES UA: NEGATIVE
NITRITE: NEGATIVE
PH: 6 (ref 5.0–8.0)
PROTEIN: NEGATIVE mg/dL
Specific Gravity, Urine: 1.019 (ref 1.005–1.030)

## 2017-05-07 LAB — LIPASE, BLOOD: Lipase: 34 U/L (ref 11–51)

## 2017-05-07 NOTE — ED Triage Notes (Signed)
Pt c/o 4/10 left abd pain radiating to left flank, no n/v/d, denies fever no urinary symptoms.

## 2017-05-08 ENCOUNTER — Emergency Department (HOSPITAL_COMMUNITY)
Admission: EM | Admit: 2017-05-08 | Discharge: 2017-05-08 | Disposition: A | Discharge status: Home / Self Care | Payer: BLUE CROSS/BLUE SHIELD | Attending: Emergency Medicine | Admitting: Emergency Medicine

## 2017-05-08 ENCOUNTER — Emergency Department (HOSPITAL_COMMUNITY): Payer: BLUE CROSS/BLUE SHIELD

## 2017-05-08 DIAGNOSIS — R109 Unspecified abdominal pain: Secondary | ICD-10-CM

## 2017-05-08 MED ORDER — PANTOPRAZOLE SODIUM 40 MG PO TBEC: 40.0000 mg | DELAYED_RELEASE_TABLET | Freq: Every day | ORAL | 0 refills | Status: AC

## 2017-05-08 MED ORDER — DIPHENHYDRAMINE HCL 50 MG/ML IJ SOLN
50.0000 mg | Freq: Once | INTRAMUSCULAR | Status: AC
  Administered 2017-05-08: 50 mg via INTRAVENOUS
  Filled 2017-05-08: qty 1

## 2017-05-08 MED ORDER — HYDROCORTISONE NA SUCCINATE PF 250 MG IJ SOLR
200.0000 mg | Freq: Once | INTRAMUSCULAR | Status: AC
  Administered 2017-05-08: 200 mg via INTRAVENOUS
  Filled 2017-05-08: qty 200

## 2017-05-08 MED ORDER — IOPAMIDOL (ISOVUE-300) INJECTION 61%
INTRAVENOUS | Status: AC
  Administered 2017-05-08: 100 mL
  Filled 2017-05-08: qty 100

## 2017-05-08 MED ORDER — FAMOTIDINE 20 MG PO TABS: 20.0000 mg | ORAL_TABLET | Freq: Two times a day (BID) | ORAL | 0 refills | Status: AC

## 2017-05-08 MED ORDER — SODIUM CHLORIDE 0.9 % IV BOLUS (SEPSIS)
1000.0000 mL | Freq: Once | INTRAVENOUS | Status: AC
  Administered 2017-05-08: 1000 mL via INTRAVENOUS

## 2017-05-08 MED ORDER — SUCRALFATE 1 GM/10ML PO SUSP: 1.0000 g | Freq: Three times a day (TID) | ORAL | 0 refills | Status: AC | PRN

## 2017-05-08 NOTE — ED Provider Notes (Signed)
[facility]ENC[facility] Provider Note   CSN: [id number] Arrival date & time: 05/07/17  2259     History   Chief Complaint Chief Complaint  Patient presents with  . Abdominal Pain  . Flank Pain    HPI Marco Vazquez is a 61 y.o. male.  Patient is a 61 year old male with past medical history of cholecystectomy and hernia repair.  He presents today for evaluation of abdominal pain.  He reports a 3-week history of pain in his epigastric region, left upper quadrant, and radiating down to the left lower quadrant.  This is been occurring intermittently.  He denies any relation to food.  He denies any nausea, vomiting, diarrhea, or constipation.  He denies any fevers or chills.  When the pain flares up, he does feel as though his abdomen becomes swollen on the left side.   The history is provided by the patient.  Abdominal Pain   This is a new problem. Episode onset: 3 weeks ago. The problem occurs constantly. The problem has been gradually worsening. The pain is located in the LUQ and epigastric region. Pertinent negatives include nausea and vomiting. Nothing aggravates the symptoms. Nothing relieves the symptoms.  Flank Pain  Associated symptoms include abdominal pain.    Past Medical History:  Diagnosis Date  . Hypertension     Patient Active Problem List   Diagnosis Date Noted  . Chest pain 10/18/2016  . Essential hypertension 10/18/2016    Past Surgical History:  Procedure Laterality Date  . DG GALL BLADDER  2009   removed  . HERNIA REPAIR    . LEFT HEART CATH AND CORONARY ANGIOGRAPHY N/A 10/20/2016   Procedure: Left Heart Cath and Coronary Angiography;  Surgeon: SwazilandJordan, Andry M, MD;  [facility];  Service: Cardiovascular;  Laterality: N/A;       Home Medications    Prior to Admission medications   Medication Sig Start Date End Date Taking? Authorizing Provider  aspirin EC 81 MG tablet Take 81 mg by mouth daily.      [provider]  Cyanocobalamin (VITAMIN B 12 PO) Take 1 tablet by mouth daily.    [provider]  losartan (COZAAR) 50 MG tablet Take 50 mg by mouth daily.    [provider]  Melatonin 5 MG TABS Take 5 mg by mouth at bedtime.    [provider]  Multiple Vitamins-Minerals (MULTIVITAMIN WITH MINERALS) tablet Take 1 tablet by mouth daily.    [provider]  pantoprazole (PROTONIX) 40 MG tablet Take 1 tablet (40 mg total) by mouth daily. 10/20/16   Penny PiaVega, [place], MD    Family History No family history on file.  Social History Social History   Tobacco Use  . Smoking status: Never Smoker  . Smokeless tobacco: Never Used  Substance Use Topics  . Alcohol use: No  . Drug use: No     Allergies   Contrast media [iodinated diagnostic agents] and Sulfa antibiotics   Review of Systems Review of Systems  Gastrointestinal: Positive for abdominal pain. Negative for nausea and vomiting.  Genitourinary: Positive for flank pain.  All other systems reviewed and are negative.    Physical Exam Updated Vital Signs BP 136/86 (BP Location: Left Arm)   Pulse 66   Temp 97.7 F (36.5 C) (Oral)   Resp 18   Ht 6' (1.829 m)   Wt 106.6 kg (235 lb)   SpO2 97%   BMI 31.87 kg/m  Physical Exam  Constitutional: He is oriented to person, place, and time. He appears well-developed and well-nourished. No distress.  HENT:  Head: Normocephalic and atraumatic.  Mouth/Throat: Oropharynx is clear and moist.  Neck: Normal range of motion. Neck supple.  Cardiovascular: Normal rate and regular rhythm. Exam reveals no friction rub.  No murmur heard. Pulmonary/Chest: Effort normal and breath sounds normal. No respiratory distress. He has no wheezes. He has no rales.  Abdominal: Soft. Bowel sounds are normal. He exhibits no distension. There is tenderness in the epigastric area and left upper quadrant. There is no rigidity, no rebound and no guarding.    Musculoskeletal: Normal range of motion. He exhibits no edema.  Neurological: He is alert and oriented to person, place, and time. Coordination normal.  Skin: Skin is warm and dry. He is not diaphoretic.  Nursing note and vitals reviewed.    ED Treatments / Results  Labs (all labs ordered are listed, but only abnormal results are displayed) Labs Reviewed  COMPREHENSIVE METABOLIC PANEL - Abnormal; Notable for the following components:      Result Value   Calcium 8.6 (*)    All other components within normal limits  CBC - Abnormal; Notable for the following components:   Platelets 147 (*)    All other components within normal limits  LIPASE, BLOOD  URINALYSIS, ROUTINE W REFLEX MICROSCOPIC    EKG  EKG Interpretation None       Radiology No results found.  Procedures Procedures (including critical care time)  Medications Ordered in ED Medications  sodium chloride 0.9 % bolus 1,000 mL (not administered)     Initial Impression / Assessment and Plan / ED Course  I have reviewed the triage vital signs and the nursing notes.  Pertinent labs & imaging results that were available during my care of the patient were reviewed by me and considered in my medical decision making (see chart for details).  Patient presenting with left-sided abdominal pain that is been ongoing for the past several weeks.  His laboratory studies are reassuring.  I discussed these findings with the patient who is requesting to proceed with a CT scan as his symptoms have been atypical for him.  Due to a contrast allergy, the patient will require a prolonged prep prior to this study.  Care will be signed out to Dr. Clayborne DanaMesner at shift change.  He will obtained the results of the CT scan and determine the final disposition.  Final Clinical Impressions(s) / ED Diagnoses   Final diagnoses:  None    ED Discharge Orders    None       Geoffery Lyonselo, Justan Gaede, MD 05/09/17 [phone]

## 2017-05-08 NOTE — ED Provider Notes (Signed)
5:03 PM Assumed care from Dr. Judd Lienelo, please see their note for full history, physical and decision making until this point. In brief this is a 61 y.o. year old male who presented to the ED tonight with Abdominal Pain and Flank Pain     Abdominal pain that has normal labs but get CT scan to ensure no intra-abdominal pathology.  CT scan normal.  Discussed situation with the patient will initiate reflux medications until follows up with PCP.  Discharge instructions, including strict return precautions for new or worsening symptoms, given. Patient and/or family verbalized understanding and agreement with the plan as described.   Labs, studies and imaging reviewed by myself and considered in medical decision making if ordered. Imaging interpreted by radiology.  Labs Reviewed  COMPREHENSIVE METABOLIC PANEL - Abnormal; Notable for the following components:      Result Value   Calcium 8.6 (*)    All other components within normal limits  CBC - Abnormal; Notable for the following components:   Platelets 147 (*)    All other components within normal limits  LIPASE, BLOOD  URINALYSIS, ROUTINE W REFLEX MICROSCOPIC    CT Abdomen Pelvis W Contrast  Final Result      No Follow-up on file.    Marco Vazquez, Marco Gelles, MD 05/08/17 [phone]

## 2019-06-23 IMAGING — CT CT ABD-PELV W/ CM
2 of 5 series · 16 of 46 positions shown, 18 images · IV contrast (iopamidol)
Comparison: None.

CLINICAL DATA: Pt c/o [DATE] left abd pain radiating to left flank,
no n/v/d, denies fever no urinary symptoms Pain x 2 weeks 633cc ISO
300^100mL U2OGNU-QII IOPAMIDOL (U2OGNU-QII) INJECTION 61%

EXAM:
CT ABDOMEN AND PELVIS WITH CONTRAST
TECHNIQUE: Multidetector CT imaging of the abdomen and pelvis was performed
using the standard protocol following bolus administration of
intravenous contrast.
CONTRAST:  100mL U2OGNU-QII IOPAMIDOL (U2OGNU-QII) INJECTION 61%

[Series 3: a/p w/ 5mm · axial · 0.96mm/px · z∈[+704,+1134]mm · 13 of 100 slices shown, 15 images]
[im 7/100  soft-tissue]
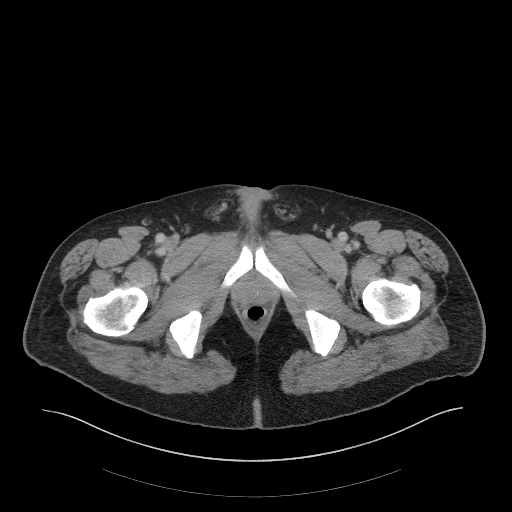
[im 7/100  bone]
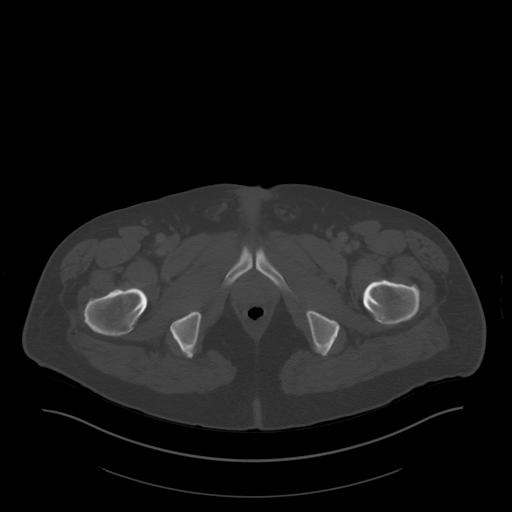
[im 13/100  soft-tissue]
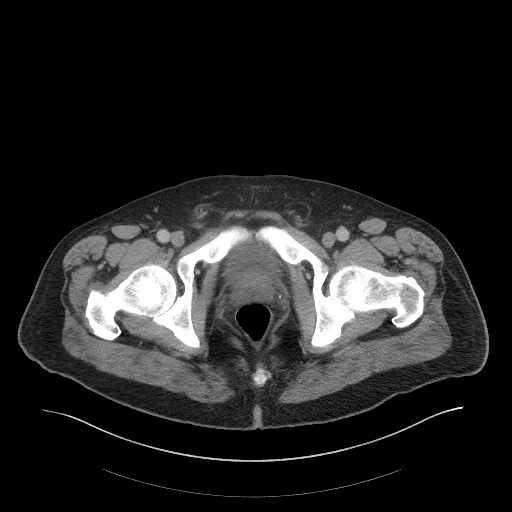
[im 19/100  soft-tissue]
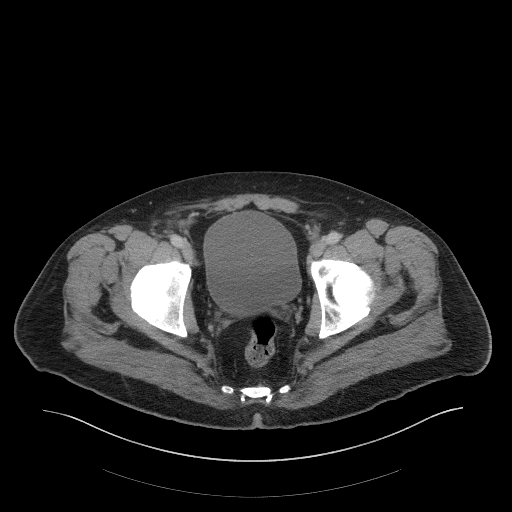
[im 31/100  soft-tissue]
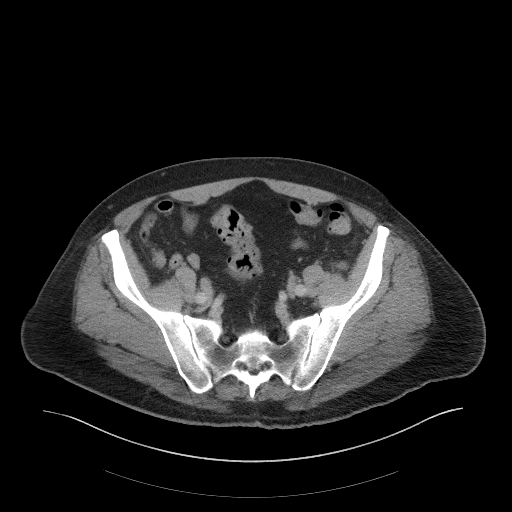
[im 38/100  soft-tissue]
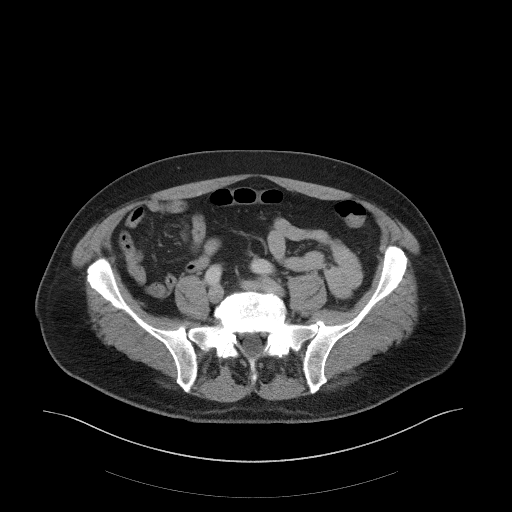
[im 44/100  soft-tissue]
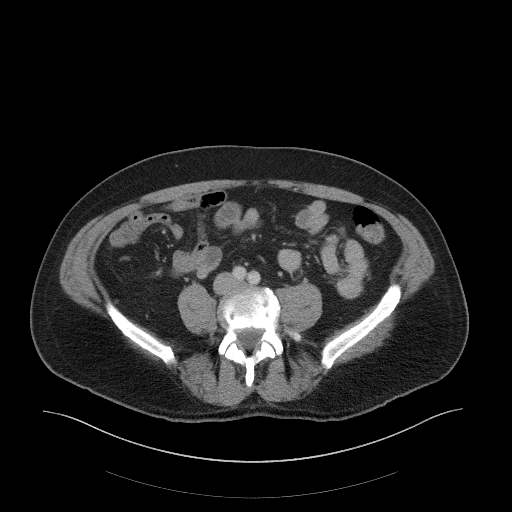
[im 50/100  soft-tissue]
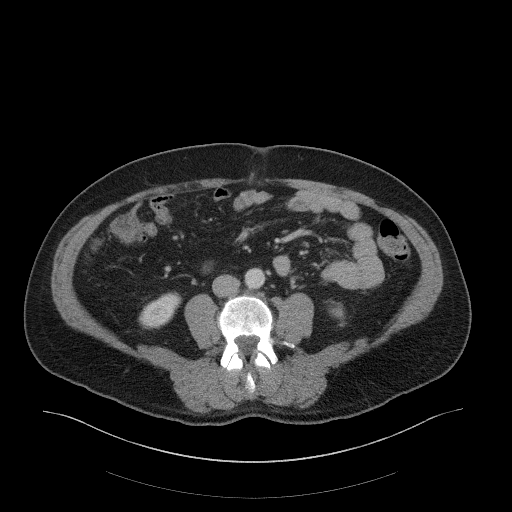
[im 56/100  soft-tissue]
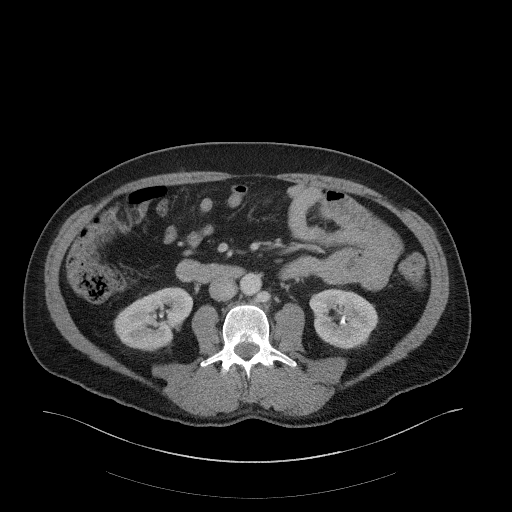
[im 62/100  soft-tissue]
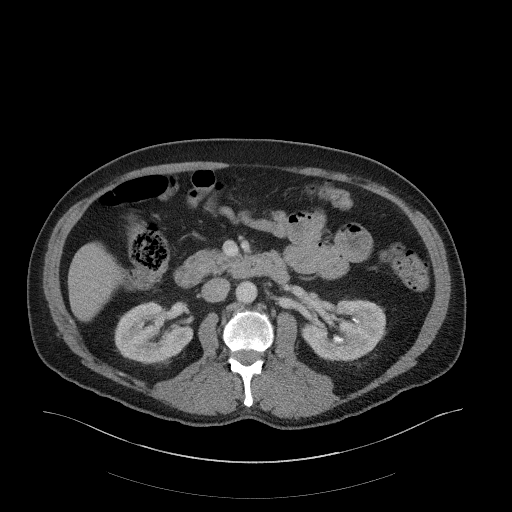
[im 62/100  bone]
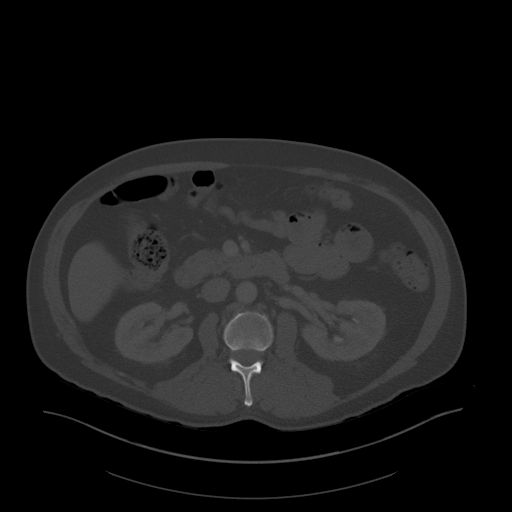
[im 69/100  soft-tissue]
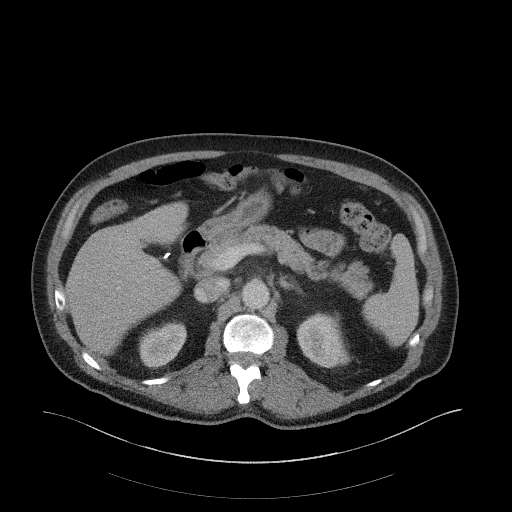
[im 81/100  soft-tissue]
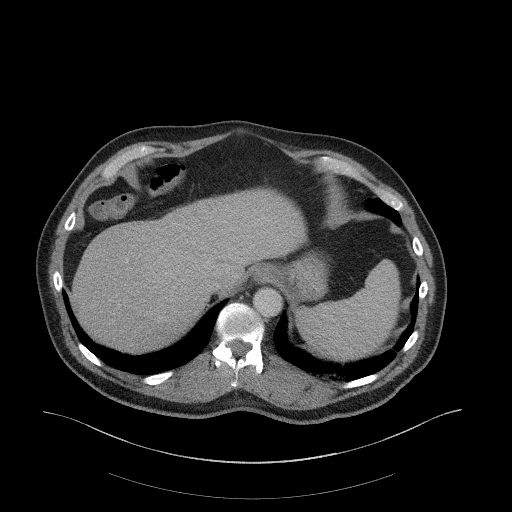
[im 87/100  soft-tissue]
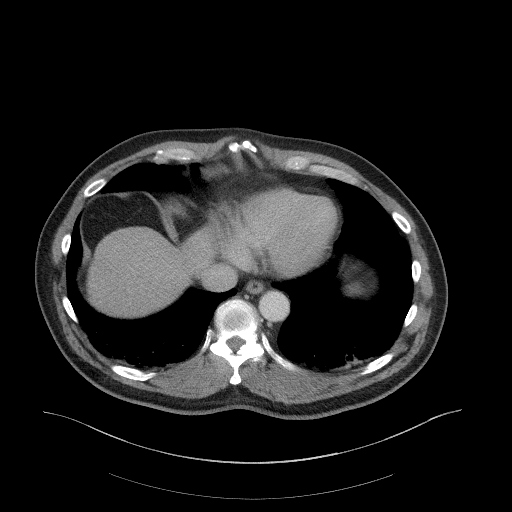
[im 93/100  soft-tissue]
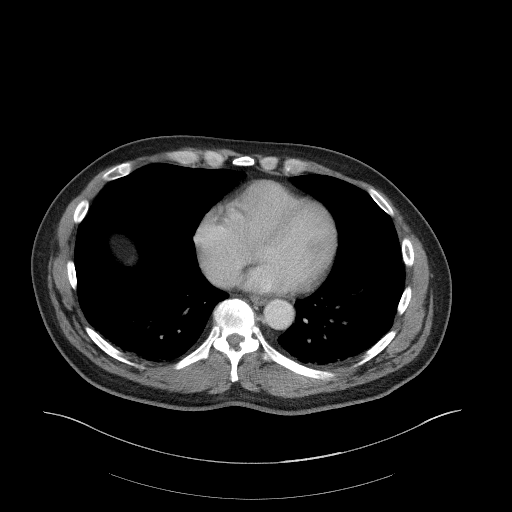

[Series 6: a/p w/ cor · coronal · 0.85mm/px · 3 of 151 slices shown]
[im 51/151  soft-tissue]
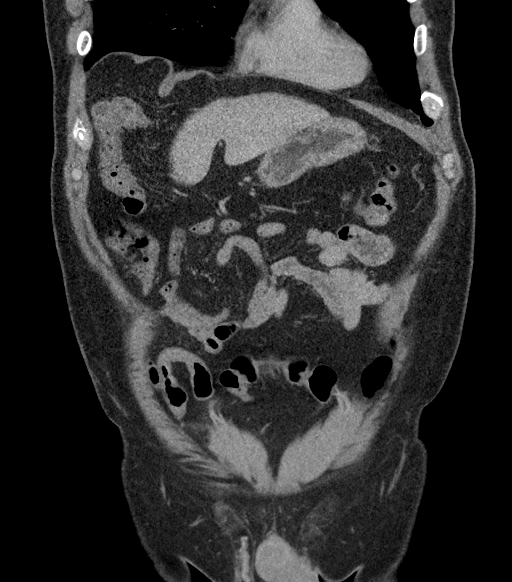
[im 67/151  soft-tissue]
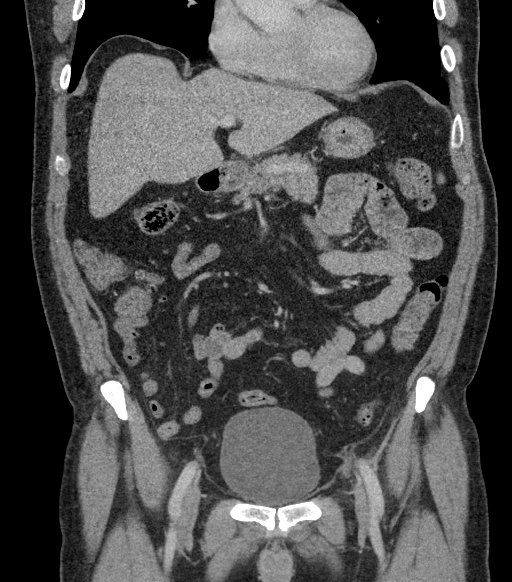
[im 84/151  soft-tissue]
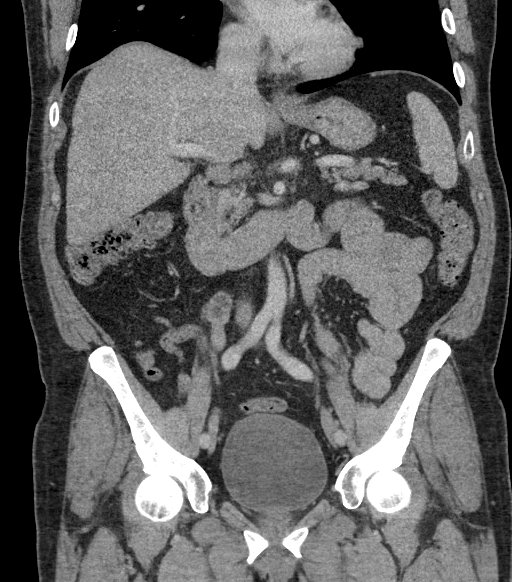

[16 of 46 positions shown; findings below may reference images not displayed]

FINDINGS: Lower chest: There is elevation of the right hemidiaphragm. There is
mild atelectasis at both lung bases. Heart size is normal.

Hepatobiliary: Status post cholecystectomy.

Pancreas: Unremarkable. No pancreatic ductal dilatation or
surrounding inflammatory changes.

Spleen: Normal in size without focal abnormality. Small left upper
quadrant splenule.

Adrenals/Urinary Tract: Adrenal glands are normal in appearance. No
renal mass or hydronephrosis. There is high attenuation material
within the collecting systems bilaterally. Considerations include
excreted contrast or multiple nonobstructing calculi. Per discussion
with the technologist, there were no delays or unusual occurrences
during scanning. It is feasible that the test dose contained a small
amount of contrast, causing this appearance. Nevertheless, no
ureteral stones or obstruction are identified. Urinary bladder is
normal in appearance.

Stomach/Bowel: The stomach and small bowel loops are normal in
appearance. The appendix is well seen and has a normal appearance.
There is moderate stool burden within normal appearing loops of
colon.

Vascular/Lymphatic: No significant vascular findings are present. No
enlarged abdominal or pelvic lymph nodes.

Reproductive: Prostate is unremarkable.

Other:  There is a small fat containing supraumbilical hernia.

Musculoskeletal: Degenerative changes are identified in the lower
lumbar spine. There is 4 mm retrolisthesis of L4 on L5. Disc height
loss at L5-S1.
IMPRESSION: 1.  No evidence for acute  abnormality.
2. Moderate stool burden.
3. Normal appendix.
4. High attenuation material within the collecting systems of the
kidneys. The appearance favors contrast over renal calculi. There is
no evidence for obstruction. If needed, follow-up noncontrast CT
could be performed.
5. Small fat containing supraumbilical hernia
